# Patient Record
Sex: Female | Born: 1970 | Race: White | Hispanic: No | Marital: Married | Smoking: Never smoker
Health system: Southern US, Community
[De-identification: ages and names within clinical notes are randomized; demographics above are authoritative.]

## PROBLEM LIST (undated history)

## (undated) DIAGNOSIS — D481 Neoplasm of uncertain behavior of connective and other soft tissue: Secondary | ICD-10-CM

---

## 1999-09-16 ENCOUNTER — Other Ambulatory Visit: Admission: RE | Admit: 1999-09-16 | Discharge: 1999-09-16 | Payer: Self-pay | Admitting: Obstetrics and Gynecology

## 2000-04-02 ENCOUNTER — Inpatient Hospital Stay (HOSPITAL_COMMUNITY): Admission: AD | Admit: 2000-04-02 | Discharge: 2000-04-02 | Payer: Self-pay | Admitting: Obstetrics and Gynecology

## 2000-04-03 ENCOUNTER — Inpatient Hospital Stay (HOSPITAL_COMMUNITY): Admission: AD | Admit: 2000-04-03 | Discharge: 2000-04-05 | Payer: Self-pay | Admitting: Obstetrics and Gynecology

## 2001-01-21 ENCOUNTER — Other Ambulatory Visit: Admission: RE | Admit: 2001-01-21 | Discharge: 2001-01-21 | Payer: Self-pay | Admitting: Obstetrics and Gynecology

## 2001-02-21 ENCOUNTER — Other Ambulatory Visit: Admission: RE | Admit: 2001-02-21 | Discharge: 2001-02-21 | Payer: Self-pay | Admitting: Obstetrics and Gynecology

## 2001-02-21 ENCOUNTER — Encounter (INDEPENDENT_AMBULATORY_CARE_PROVIDER_SITE_OTHER): Payer: Self-pay

## 2001-06-18 ENCOUNTER — Other Ambulatory Visit: Admission: RE | Admit: 2001-06-18 | Discharge: 2001-06-18 | Payer: Self-pay | Admitting: Obstetrics and Gynecology

## 2001-07-27 ENCOUNTER — Inpatient Hospital Stay (HOSPITAL_COMMUNITY): Admission: AD | Admit: 2001-07-27 | Discharge: 2001-07-27 | Payer: Self-pay | Admitting: Obstetrics & Gynecology

## 2001-09-01 ENCOUNTER — Inpatient Hospital Stay (HOSPITAL_COMMUNITY): Admission: AD | Admit: 2001-09-01 | Discharge: 2001-09-03 | Payer: Self-pay | Admitting: Obstetrics & Gynecology

## 2002-06-12 ENCOUNTER — Other Ambulatory Visit: Admission: RE | Admit: 2002-06-12 | Discharge: 2002-06-12 | Payer: Self-pay | Admitting: Gynecology

## 2003-07-08 ENCOUNTER — Other Ambulatory Visit: Admission: RE | Admit: 2003-07-08 | Discharge: 2003-07-08 | Payer: Self-pay | Admitting: Obstetrics and Gynecology

## 2004-04-25 ENCOUNTER — Ambulatory Visit (HOSPITAL_COMMUNITY): Admission: RE | Admit: 2004-04-25 | Discharge: 2004-04-25 | Payer: Self-pay | Admitting: Obstetrics and Gynecology

## 2004-06-21 ENCOUNTER — Inpatient Hospital Stay (HOSPITAL_COMMUNITY): Admission: AD | Admit: 2004-06-21 | Discharge: 2004-06-21 | Payer: Self-pay | Admitting: Obstetrics and Gynecology

## 2004-08-09 ENCOUNTER — Inpatient Hospital Stay (HOSPITAL_COMMUNITY): Admission: AD | Admit: 2004-08-09 | Discharge: 2004-08-11 | Payer: Self-pay | Admitting: Obstetrics and Gynecology

## 2004-09-13 ENCOUNTER — Other Ambulatory Visit: Admission: RE | Admit: 2004-09-13 | Discharge: 2004-09-13 | Payer: Self-pay | Admitting: Obstetrics and Gynecology

## 2005-10-03 ENCOUNTER — Other Ambulatory Visit: Admission: RE | Admit: 2005-10-03 | Discharge: 2005-10-03 | Payer: Self-pay | Admitting: Obstetrics and Gynecology

## 2007-01-27 ENCOUNTER — Inpatient Hospital Stay (HOSPITAL_COMMUNITY): Admission: AD | Admit: 2007-01-27 | Discharge: 2007-01-30 | Payer: Self-pay | Admitting: Obstetrics and Gynecology

## 2007-01-28 ENCOUNTER — Encounter (INDEPENDENT_AMBULATORY_CARE_PROVIDER_SITE_OTHER): Payer: Self-pay | Admitting: Specialist

## 2007-07-01 ENCOUNTER — Emergency Department (HOSPITAL_COMMUNITY): Admission: EM | Admit: 2007-07-01 | Discharge: 2007-07-01 | Payer: Self-pay | Admitting: Emergency Medicine

## 2009-03-25 ENCOUNTER — Ambulatory Visit (HOSPITAL_COMMUNITY): Admission: RE | Admit: 2009-03-25 | Discharge: 2009-03-25 | Payer: Self-pay | Admitting: Obstetrics and Gynecology

## 2009-03-25 HISTORY — PX: LAPAROSCOPIC TUBAL LIGATION: SUR803

## 2009-09-18 DIAGNOSIS — D481 Neoplasm of uncertain behavior of connective and other soft tissue: Secondary | ICD-10-CM

## 2009-09-18 DIAGNOSIS — D48119 Desmoid tumor of unspecified site: Secondary | ICD-10-CM

## 2009-09-18 HISTORY — DX: Neoplasm of uncertain behavior of connective and other soft tissue: D48.1

## 2009-09-18 HISTORY — DX: Desmoid tumor of unspecified site: D48.119

## 2009-12-16 ENCOUNTER — Encounter: Admission: RE | Admit: 2009-12-16 | Discharge: 2009-12-16 | Payer: Self-pay | Admitting: Obstetrics and Gynecology

## 2009-12-24 ENCOUNTER — Encounter: Admission: RE | Admit: 2009-12-24 | Discharge: 2009-12-24 | Payer: Self-pay | Admitting: Obstetrics and Gynecology

## 2010-01-26 ENCOUNTER — Ambulatory Visit (HOSPITAL_BASED_OUTPATIENT_CLINIC_OR_DEPARTMENT_OTHER): Admission: RE | Admit: 2010-01-26 | Discharge: 2010-01-26 | Payer: Self-pay | Admitting: Surgery

## 2010-01-26 HISTORY — PX: CHEST WALL RESECTION: SHX914

## 2010-02-07 ENCOUNTER — Encounter: Admission: RE | Admit: 2010-02-07 | Discharge: 2010-02-07 | Payer: Self-pay | Admitting: Orthopaedic Surgery

## 2010-05-10 ENCOUNTER — Emergency Department (HOSPITAL_COMMUNITY): Admission: EM | Admit: 2010-05-10 | Discharge: 2010-05-10 | Payer: Self-pay | Admitting: Emergency Medicine

## 2010-10-09 ENCOUNTER — Encounter: Payer: Self-pay | Admitting: Obstetrics and Gynecology

## 2010-12-06 LAB — POCT HEMOGLOBIN-HEMACUE: Hemoglobin: 13.7 g/dL (ref 12.0–15.0)

## 2010-12-26 LAB — CBC
Hemoglobin: 14.8 g/dL (ref 12.0–15.0)
MCHC: 34.6 g/dL (ref 30.0–36.0)
MCV: 87.6 fL (ref 78.0–100.0)
RBC: 4.87 MIL/uL (ref 3.87–5.11)
WBC: 9.1 10*3/uL (ref 4.0–10.5)

## 2011-01-31 NOTE — Op Note (Signed)
Alexis Cannon, Alexis Cannon             ACCOUNT NO.:  [id number]   MEDICAL RECORD NO.:  [id number]          PATIENT TYPE:  AMB   LOCATION:  SDC                           FACILITY:  [facility]   PHYSICIAN:  Malva Limes, M.D.    DATE OF BIRTH:  1970/12/25   DATE OF PROCEDURE:  03/25/2009  DATE OF DISCHARGE:                               OPERATIVE REPORT   PREOPERATIVE DIAGNOSES:  1. Menorrhagia.  2. The patient desires primary sterilization.   POSTOPERATIVE DIAGNOSES:  1. Menorrhagia.  2. The patient desires primary sterilization.   PROCEDURES:  1. Laparoscopic application of Filshie clips.  2. Dilation and curettage.  3. NovaSure endometrial ablation.   SURGEON:  Malva Limes, MD   ANESTHESIA:  General.   ANTIBIOTICS:  Ancef 1 g.   DRAINS:  Red rubber catheter to bladder.   COMPLICATIONS:  None.   SPECIMENS:  None.   FINDINGS:  The patient had normal fallopian tubes and ovaries  bilaterally.  Uterus appeared to be normal with no evidence of  endometriosis or adhesions.   DESCRIPTION OF PROCEDURE:  The patient was taken to the operating room  where general anesthetic was administered without difficulty.  She was  then placed in dorsal lithotomy position.  She was prepped and draped in  the usual fashion for this procedure.  Her bladder was drained with a  red rubber catheter.  Umbilicus was injected with 0.25% Marcaine.  A  vertical skin incision was made.  The fascia was identified, grasped  with 2 Kochers and entered with Mayo scissors.  Parietal peritoneum was  entered sharply and 0 Vicryl suture was placed in pursestring fashion.  The Hasson cannula was placed in the peritoneal cavity.  The abdominal  cavity was insufflated with 3 L of carbon dioxide.  The patient was  placed in Trendelenburg.  Examination of the pelvic viscera was  undertaken with findings as noted above.  At this point, the Filshie  clip applicator was placed through the scope.  A Filshie clip was  placed  on the right fallopian tube in the isthmic portion.  The clip was placed  perpendicular to the tube.  The entire tube appeared to be within the  clasp.  The clasp appeared to be tightly closed.  Similar procedure was  performed on the opposite side.  At this point, the pneumoperitoneum and  instruments were removed.  The fascia was closed with 0 Vicryl suture in  a pursestring fashion.  The skin with 4-0 Vicryl suture in subcuticular  fashion.  At this point, attention was turned to the vagina, where a  single-tooth tenaculum was applied to the anterior cervical lip.  The  cervix was serially dilated to a 29-French.  The uterus was sounded 9  cm.  The cervical length was 3 cm.  Sharp curettage was then performed.  NovaSure device was placed into abdominal cavity and opened, the width  was 4.4 cm and the length of the cavity 6 cm.  The NovaSure device was  turned on for a time total of 55 seconds.  The patient the tolerated  procedure well.  The device was removed.  The patient was extubated,  taken to recovery room in stable condition.  Instrument and lap counts  were correct x1.  The patient will be discharged to home.  She will  follow up in the office in 4 weeks.  She was sent home with Percocet to  take p.r.n.           ______________________________  Malva Limes, M.D.     MA/MEDQ  D:  03/25/2009  T:  03/25/2009  Job:  [id number]

## 2011-02-03 NOTE — Discharge Summary (Signed)
NAMEELI, Alexis Cannon             ACCOUNT NO.:  [id number]   MEDICAL RECORD NO.:  [id number]          PATIENT TYPE:  INP   LOCATION:  9136                          FACILITY:  [facility]   PHYSICIAN:  Gerrit Friends. Aldona Bar, M.D.   DATE OF BIRTH:  Jan 24, 1971   DATE OF ADMISSION:  08/09/2004  DATE OF DISCHARGE:                                 DISCHARGE SUMMARY   DISCHARGE DIAGNOSES:  1.  Term pregnancy, delivered, 8-pound 12-ounce female infant, Apgars 9 and 9.  2.  Blood type A positive.  3.  Induction of labor.   PROCEDURES:  Normal spontaneous delivery.   SUMMARY:  This 40 year old gravida 3 para 2 was admitted at [redacted] weeks  gestation for induction after an uncomplicated pregnancy.  At the time of  admission she was 1 cm dilated, 50% effaced, vertex, -2 station.  Amniotomy  was carried out with production of clear fluid and shortly thereafter she  was begun on Pitocin augmentation.  She progressed and subsequently had a  normal spontaneous delivery over a intact perineum of a viable 8-pound 12-  ounce female infant with Apgars of 9 and 9.  Her postpartum course was  uncomplicated.  Her discharge hemoglobin was 8.6 with a white count of  15,800 and a platelet count of 217,000.  On the morning of August 11, 2004  she was ambulating well, tolerating a regular diet well, having normal bowel  and bladder function, was afebrile, and she was both breastfeeding and  bottle feeding.  She was desirous of discharge and accordingly was given all  appropriate instructions and discharged to home.  Discharge medications  include vitamins one a day, ferrous sulfate 300 mg once or twice a day, and  she will use Motrin or Tylenol as needed for discomfort.  She will return to  the office for follow-up in approximately 4 weeks time.   CONDITION ON DISCHARGE:  Improved.      RMW/MEDQ  D:  08/11/2004  T:  08/11/2004  Job:  [id number]

## 2014-03-29 ENCOUNTER — Emergency Department (HOSPITAL_COMMUNITY)
Admission: EM | Admit: 2014-03-29 | Discharge: 2014-03-29 | Disposition: A | Discharge status: Home / Self Care | Payer: BC Managed Care – PPO | Source: Home / Self Care | Attending: Family Medicine | Admitting: Family Medicine

## 2014-03-29 ENCOUNTER — Encounter (HOSPITAL_COMMUNITY): Payer: Self-pay | Admitting: Emergency Medicine

## 2014-03-29 DIAGNOSIS — IMO0002 Reserved for concepts with insufficient information to code with codable children: Secondary | ICD-10-CM

## 2014-03-29 DIAGNOSIS — S39011A Strain of muscle, fascia and tendon of abdomen, initial encounter: Secondary | ICD-10-CM

## 2014-03-29 DIAGNOSIS — X58XXXA Exposure to other specified factors, initial encounter: Secondary | ICD-10-CM

## 2014-03-29 LAB — CBC
HEMATOCRIT: 40.7 % (ref 36.0–46.0)
HEMOGLOBIN: 13.3 g/dL (ref 12.0–15.0)
MCH: 28.2 pg (ref 26.0–34.0)
MCHC: 32.7 g/dL (ref 30.0–36.0)
MCV: 86.4 fL (ref 78.0–100.0)
PLATELETS: 290 10*3/uL (ref 150–400)
RBC: 4.71 MIL/uL (ref 3.87–5.11)
RDW: 13.2 % (ref 11.5–15.5)
WBC: 11.3 10*3/uL — AB (ref 4.0–10.5)

## 2014-03-29 LAB — COMPREHENSIVE METABOLIC PANEL
ALBUMIN: 4 g/dL (ref 3.5–5.2)
ALK PHOS: 101 U/L (ref 39–117)
ALT: 36 U/L — AB (ref 0–35)
AST: 19 U/L (ref 0–37)
Anion gap: 16 — ABNORMAL HIGH (ref 5–15)
BUN: 11 mg/dL (ref 6–23)
CO2: 26 meq/L (ref 19–32)
Calcium: 9.3 mg/dL (ref 8.4–10.5)
Chloride: 97 mEq/L (ref 96–112)
Creatinine, Ser: 0.61 mg/dL (ref 0.50–1.10)
GFR calc non Af Amer: >90 mL/min (ref >90)
GLUCOSE: 104 mg/dL — AB (ref 70–99)
Potassium: 3.9 mEq/L (ref 3.7–5.3)
Sodium: 139 mEq/L (ref 137–147)
Total Bilirubin: 0.3 mg/dL (ref 0.3–1.2)
Total Protein: 7.8 g/dL (ref 6.0–8.3)

## 2014-03-29 LAB — POCT URINALYSIS DIP (DEVICE)
Bilirubin Urine: NEGATIVE
Glucose, UA: NEGATIVE mg/dL
KETONES UR: NEGATIVE mg/dL
Nitrite: NEGATIVE
PROTEIN: NEGATIVE mg/dL
Specific Gravity, Urine: 1.02 (ref 1.005–1.030)
UROBILINOGEN UA: 0.2 mg/dL (ref 0.0–1.0)
pH: 5.5 (ref 5.0–8.0)

## 2014-03-29 LAB — LIPASE, BLOOD: Lipase: 19 U/L (ref 11–59)

## 2014-03-29 LAB — POCT PREGNANCY, URINE: Preg Test, Ur: NEGATIVE

## 2014-03-29 MED ORDER — GI COCKTAIL ~~LOC~~
30.0000 mL | Freq: Once | ORAL | Status: AC
  Administered 2014-03-29: 30 mL via ORAL

## 2014-03-29 MED ORDER — OMEPRAZOLE 40 MG PO CPDR: 40.0000 mg | DELAYED_RELEASE_CAPSULE | Freq: Every day | ORAL | Status: DC

## 2014-03-29 MED ORDER — TRAMADOL HCL 50 MG PO TABS: 50.0000 mg | ORAL_TABLET | Freq: Four times a day (QID) | ORAL | Status: DC | PRN

## 2014-03-29 MED ORDER — GI COCKTAIL ~~LOC~~
ORAL | Status: AC
  Filled 2014-03-29: qty 30

## 2014-03-29 NOTE — ED Notes (Signed)
C/o hernia in abd area since last Friday States she is nausea Admits to pain when moving

## 2014-03-29 NOTE — Discharge Instructions (Signed)
Thank you for coming in today. Take omeprazole for the possibility of stomach irritation Use tramadol for severe pain.  I think you have a strain of the abdominal wall muscles.  Go to the emergency room if you get worse.  If your belly pain worsens, or you have high fever, bad vomiting, blood in your stool or black tarry stool go to the Emergency Room.   Abdominal Pain, Women Abdominal (stomach, pelvic, or belly) pain can be caused by many things. It is important to tell your doctor:  The location of the pain.  Does it come and go or is it present all the time?  Are there things that start the pain (eating certain foods, exercise)?  Are there other symptoms associated with the pain (fever, nausea, vomiting, diarrhea)? All of this is helpful to know when trying to find the cause of the pain. CAUSES   Stomach: virus or bacteria infection, or ulcer.  Intestine: appendicitis (inflamed appendix), regional ileitis (Crohn's disease), ulcerative colitis (inflamed colon), irritable bowel syndrome, diverticulitis (inflamed diverticulum of the colon), or cancer of the stomach or intestine.  Gallbladder disease or stones in the gallbladder.  Kidney disease, kidney stones, or infection.  Pancreas infection or cancer.  Fibromyalgia (pain disorder).  Diseases of the female organs:  Uterus: fibroid (non-cancerous) tumors or infection.  Fallopian tubes: infection or tubal pregnancy.  Ovary: cysts or tumors.  Pelvic adhesions (scar tissue).  Endometriosis (uterus lining tissue growing in the pelvis and on the pelvic organs).  Pelvic congestion syndrome (female organs filling up with blood just before the menstrual period).  Pain with the menstrual period.  Pain with ovulation (producing an egg).  Pain with an IUD (intrauterine device, birth control) in the uterus.  Cancer of the female organs.  Functional pain (pain not caused by a disease, may improve without  treatment).  Psychological pain.  Depression. DIAGNOSIS  Your doctor will decide the seriousness of your pain by doing an examination.  Blood tests.  X-rays.  Ultrasound.  CT scan (computed tomography, special type of X-ray).  MRI (magnetic resonance imaging).  Cultures, for infection.  Barium enema (dye inserted in the large intestine, to better view it with X-rays).  Colonoscopy (looking in intestine with a lighted tube).  Laparoscopy (minor surgery, looking in abdomen with a lighted tube).  Major abdominal exploratory surgery (looking in abdomen with a large incision). TREATMENT  The treatment will depend on the cause of the pain.   Many cases can be observed and treated at home.  Over-the-counter medicines recommended by your caregiver.  Prescription medicine.  Antibiotics, for infection.  Birth control pills, for painful periods or for ovulation pain.  Hormone treatment, for endometriosis.  Nerve blocking injections.  Physical therapy.  Antidepressants.  Counseling with a psychologist or psychiatrist.  Minor or major surgery. HOME CARE INSTRUCTIONS   Do not take laxatives, unless directed by your caregiver.  Take over-the-counter pain medicine only if ordered by your caregiver. Do not take aspirin because it can cause an upset stomach or bleeding.  Try a clear liquid diet (broth or water) as ordered by your caregiver. Slowly move to a bland diet, as tolerated, if the pain is related to the stomach or intestine.  Have a thermometer and take your temperature several times a day, and record it.  Bed rest and sleep, if it helps the pain.  Avoid sexual intercourse, if it causes pain.  Avoid stressful situations.  Keep your follow-up appointments and tests, as your  caregiver orders.  If the pain does not go away with medicine or surgery, you may try:  Acupuncture.  Relaxation exercises (yoga, meditation).  Group therapy.  Counseling. SEEK  MEDICAL CARE IF:   You notice certain foods cause stomach pain.  Your home care treatment is not helping your pain.  You need stronger pain medicine.  You want your IUD removed.  You feel faint or lightheaded.  You develop nausea and vomiting.  You develop a rash.  You are having side effects or an allergy to your medicine. SEEK IMMEDIATE MEDICAL CARE IF:   Your pain does not go away or gets worse.  You have a fever.  Your pain is felt only in portions of the abdomen. The right side could possibly be appendicitis. The left lower portion of the abdomen could be colitis or diverticulitis.  You are passing blood in your stools (bright red or black tarry stools, with or without vomiting).  You have blood in your urine.  You develop chills, with or without a fever.  You pass out. MAKE SURE YOU:   Understand these instructions.  Will watch your condition.  Will get help right away if you are not doing well or get worse. Document Released: 07/02/2007 Document Revised: 11/27/2011 Document Reviewed: 07/22/2009 [facility] Patient Information 2015 [place], [place]. This information is not intended to replace advice given to you by your health care provider. Make sure you discuss any questions you have with your health care provider.

## 2014-03-29 NOTE — ED Provider Notes (Signed)
Alexis Cannon is a 43 y.o. female who presents to Urgent Care today for abdominal pain. Patient has 3 days of midline epigastric abdominal pain associated with nausea. She also notes that she has been moving furniture and doing a lot of coughing recently. She suspects that she may have injured her abdominal wall muscles. She has no vomiting or diarrhea fevers or chills. She denies any significant urinary symptoms or vaginal discharge. She has a history of a bilateral tubal ligation but no other abdominal surgery. She has not tried any medications yet. The pain is moderate and worse with activity. She notes bulging around her umbilicus.   History reviewed. No pertinent past medical history. History  Substance Use Topics  . Smoking status: Not on file  . Smokeless tobacco: Not on file  . Alcohol Use: Not on file   ROS as above Medications: No current facility-administered medications for this encounter.   Current Outpatient Prescriptions  Medication Sig Dispense Refill  . omeprazole (PRILOSEC) 40 MG capsule Take 1 capsule (40 mg total) by mouth daily.  30 capsule  1    Exam:  BP 140/94  Pulse 83  Temp(Src) 98.1 F (36.7 C) (Oral)  Resp 18  SpO2 97%  LMP 03/13/2014 Gen: Well NAD HEENT: EOMI,  MMM Lungs: Normal work of breathing. CTABL Heart: RRR no MRG Abd: NABS, Soft. , ND obese. No significant herniation. Tender palpation to the right of the umbilicus and in the epigastric area with some guarding present. Exts: Brisk capillary refill, warm and well perfused.   Patient had some improvement with GI cocktail.  Results for orders placed during the hospital encounter of 03/29/14 (from the past 24 hour(s))  CBC     Status: Abnormal   Collection Time    03/29/14 11:23 AM      Result Value Ref Range   WBC 11.3 (*) 4.0 - 10.5 K/uL   RBC 4.71  3.87 - 5.11 MIL/uL   Hemoglobin 13.3  12.0 - 15.0 g/dL   HCT 40.7  36.0 - 46.0 %   MCV 86.4  78.0 - 100.0 fL   MCH 28.2  26.0 - 34.0 pg    MCHC 32.7  30.0 - 36.0 g/dL   RDW 13.2  11.5 - 15.5 %   Platelets 290  150 - 400 K/uL  COMPREHENSIVE METABOLIC PANEL     Status: Abnormal   Collection Time    03/29/14 11:23 AM      Result Value Ref Range   Sodium 139  137 - 147 mEq/L   Potassium 3.9  3.7 - 5.3 mEq/L   Chloride 97  96 - 112 mEq/L   CO2 26  19 - 32 mEq/L   Glucose, Bld 104 (*) 70 - 99 mg/dL   BUN 11  6 - 23 mg/dL   Creatinine, Ser 0.61  0.50 - 1.10 mg/dL   Calcium 9.3  8.4 - 10.5 mg/dL   Total Protein 7.8  6.0 - 8.3 g/dL   Albumin 4.0  3.5 - 5.2 g/dL   AST 19  0 - 37 U/L   ALT 36 (*) 0 - 35 U/L   Alkaline Phosphatase 101  39 - 117 U/L   Total Bilirubin 0.3  0.3 - 1.2 mg/dL   GFR calc non Af Amer >90  >90 mL/min   GFR calc Af Amer >90  >90 mL/min   Anion gap 16 (*) 5 - 15  LIPASE, BLOOD     Status: None  Collection Time    03/29/14 11:23 AM      Result Value Ref Range   Lipase 19  11 - 59 U/L  POCT URINALYSIS DIP (DEVICE)     Status: Abnormal   Collection Time    03/29/14 11:34 AM      Result Value Ref Range   Glucose, UA NEGATIVE  NEGATIVE mg/dL   Bilirubin Urine NEGATIVE  NEGATIVE   Ketones, ur NEGATIVE  NEGATIVE mg/dL   Specific Gravity, Urine 1.020  1.005 - 1.030   Hgb urine dipstick TRACE (*) NEGATIVE   pH 5.5  5.0 - 8.0   Protein, ur NEGATIVE  NEGATIVE mg/dL   Urobilinogen, UA 0.2  0.0 - 1.0 mg/dL   Nitrite NEGATIVE  NEGATIVE   Leukocytes, UA MODERATE (*) NEGATIVE  POCT PREGNANCY, URINE     Status: None   Collection Time    03/29/14 11:46 AM      Result Value Ref Range   Preg Test, Ur NEGATIVE  NEGATIVE   No results found.  Assessment and Plan: 43 y.o. female with abdominal wall muscular strain. Most likely etiology. White blood cell count is very mildly elevated as is a healthy. I don't think these are clinically relevant today. Plan for watchful waiting. Additionally we'll use omeprazole as GI cocktail seemed to help. Possible gastritis. Additionally tramadol for pain control. Followup  with primary care provider.  Discussed warning signs or symptoms. Please see discharge instructions. Patient expresses understanding.    Gregor Hams, MD 03/29/14 [phone]

## 2014-10-18 ENCOUNTER — Ambulatory Visit (INDEPENDENT_AMBULATORY_CARE_PROVIDER_SITE_OTHER): Payer: BC Managed Care – PPO | Admitting: Internal Medicine

## 2014-10-18 ENCOUNTER — Ambulatory Visit (INDEPENDENT_AMBULATORY_CARE_PROVIDER_SITE_OTHER): Payer: BC Managed Care – PPO

## 2014-10-18 VITALS — BP 128/88 | HR 96 | Temp 98.3°F | Resp 20 | Ht 64.25 in | Wt 257.0 lb

## 2014-10-18 DIAGNOSIS — R059 Cough, unspecified: Secondary | ICD-10-CM

## 2014-10-18 DIAGNOSIS — R05 Cough: Secondary | ICD-10-CM

## 2014-10-18 DIAGNOSIS — K2970 Gastritis, unspecified, without bleeding: Secondary | ICD-10-CM

## 2014-10-18 DIAGNOSIS — R079 Chest pain, unspecified: Secondary | ICD-10-CM

## 2014-10-18 LAB — COMPLETE METABOLIC PANEL WITH GFR
ALK PHOS: 76 U/L (ref 39–117)
ALT: 60 U/L — ABNORMAL HIGH (ref 0–35)
AST: 31 U/L (ref 0–37)
Albumin: 4.5 g/dL (ref 3.5–5.2)
BUN: 12 mg/dL (ref 6–23)
CO2: 26 meq/L (ref 19–32)
Calcium: 9.9 mg/dL (ref 8.4–10.5)
Chloride: 103 mEq/L (ref 96–112)
Creat: 0.68 mg/dL (ref 0.50–1.10)
GFR, Est African American: >89 mL/min
GFR, Est Non African American: >89 mL/min
Glucose, Bld: 100 mg/dL — ABNORMAL HIGH (ref 70–99)
Potassium: 4.3 mEq/L (ref 3.5–5.3)
SODIUM: 138 meq/L (ref 135–145)
Total Bilirubin: 0.3 mg/dL (ref 0.2–1.2)
Total Protein: 7.5 g/dL (ref 6.0–8.3)

## 2014-10-18 LAB — POCT CBC
Granulocyte percent: 62.7 %G (ref 37–80)
HEMATOCRIT: 43 % (ref 37.7–47.9)
Hemoglobin: 14 g/dL (ref 12.2–16.2)
Lymph, poc: 3.2 (ref 0.6–3.4)
MCH: 27.8 pg (ref 27–31.2)
MCHC: 32.5 g/dL (ref 31.8–35.4)
MCV: 85.6 fL (ref 80–97)
MID (CBC): 0.4 (ref 0–0.9)
MPV: 6.8 fL (ref 0–99.8)
PLATELET COUNT, POC: 321 10*3/uL (ref 142–424)
POC GRANULOCYTE: 6.1 (ref 2–6.9)
POC LYMPH %: 32.8 % (ref 10–50)
POC MID %: 4.5 % (ref 0–12)
RBC: 5.02 M/uL (ref 4.04–5.48)
RDW, POC: 13.4 %
WBC: 9.7 10*3/uL (ref 4.6–10.2)

## 2014-10-18 LAB — LIPID PANEL
CHOLESTEROL: 209 mg/dL — AB (ref 0–200)
HDL: 44 mg/dL (ref >39)
LDL Cholesterol: 98 mg/dL (ref 0–99)
Total CHOL/HDL Ratio: 4.8 Ratio
Triglycerides: 335 mg/dL — ABNORMAL HIGH (ref <150)
VLDL: 67 mg/dL — ABNORMAL HIGH (ref 0–40)

## 2014-10-18 LAB — TSH: TSH: 2.123 u[IU]/mL (ref 0.350–4.500)

## 2014-10-18 MED ORDER — SUCRALFATE 1 GM/10ML PO SUSP: 1.0000 g | Freq: Three times a day (TID) | ORAL | Status: DC

## 2014-10-18 MED ORDER — RANITIDINE HCL 150 MG PO TABS: 150.0000 mg | ORAL_TABLET | Freq: Two times a day (BID) | ORAL | Status: DC

## 2014-10-18 NOTE — Progress Notes (Signed)
Subjective:    Patient ID: Alexis Cannon, female    DOB: 1971-08-27, 44 y.o.   MRN: [id number]  HPI Pt presents to clinic with chest pain for the last week.  She is concerned that something is going on due to all her symptoms 1- cough - for the last about 6 weeks - she had an illness like the flu and all the other symptoms went away but the cough - the cough is worse in the am with white colored sputum - the other time the cough starts is after she eats almost every time not matter what she eats.  She has no SOB or wheezing. 2- about a 1 week h/o of mid back pain that feels like a knife is stabbing her - it does not feel like a muscle spasms - her husband has tried to rub it and that has not helped.  It has slightly gotten better since its onset 1 week ago.  She did not have an injury.  She is not sure whether it is worse when she is having her chest pain. 3- chest pain - pressure and fullness feeling in her chest - worse when she is stressed but it is not worse with activity or with rest.  It does seem to be worse after eating but it does not matter what she eats.  She tried baking soda and that did not help much.  She has tried to decreased her spicy and acidic foods over the last week and she feels like it is slightly better.  Family history - dad with MI and paternal female cousin MI at age 7.  She is under a lot of stress.  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  She is busy all the time.  She tries to eat healthy - organic and non-gmo and she dos not exercise.  She knows she is overweight and she would like to lose weight.  She likes to exercise but she just cannot find the time currently.  She has h/o mild anorexia but she is aware of healthy eating and calories in must be less than calories out for weight loss.  She would like help.    Review of Systems  Constitutional: Negative for fever and chills.  HENT: Negative for congestion, postnasal drip, rhinorrhea and sinus  pressure.   Respiratory: Positive for cough. Negative for shortness of breath and wheezing.   Gastrointestinal: Positive for vomiting (2nd to cough). Negative for nausea, abdominal pain and diarrhea.  Musculoskeletal: Positive for back pain.  Neurological: Negative for dizziness and headaches.   There are no active problems to display for this patient.  Prior to Admission medications   Medication Sig Start Date End Date Taking? Authorizing Provider  COPPER PO Take by mouth.   Yes Historical Provider, MD  Cyanocobalamin (VITAMIN B 12 PO) Take by mouth.   Yes Historical Provider, MD  Omega-3 Fatty Acids (OMEGA 3 PO) Take by mouth.   Yes Historical Provider, MD  VITAMIN A PO Take by mouth.   Yes Historical Provider, MD   Allergies  Allergen Reactions  . Codeine Other (See Comments)    Causes the patient to become hyper.     Medications, allergies, past medical history, surgical history, family history, social history and problem list reviewed and updated.      Objective:   Physical Exam  Constitutional: She is oriented to person, place, and time. She appears well-developed and well-nourished.  BP 128/88 mmHg  Pulse 96  Temp(Src) 98.3 F (36.8 C) (Oral)  Resp 20  Ht 5' 4.25" (1.632 m)  Wt 257 lb (116.574 kg)  BMI 43.77 kg/m2  SpO2 99%  LMP 10/13/2014   HENT:  Head: Normocephalic and atraumatic.  Right Ear: Hearing, tympanic membrane, external ear and ear canal normal.  Left Ear: Hearing, tympanic membrane, external ear and ear canal normal.  Nose: Nose normal.  Mouth/Throat: Uvula is midline, oropharynx is clear and moist and mucous membranes are normal.  Eyes: Conjunctivae are normal.  Neck: Normal range of motion.  Cardiovascular: Normal rate, regular rhythm and normal heart sounds.   No murmur heard. Pulmonary/Chest: Effort normal and breath sounds normal. She has no wheezes.  Abdominal: Soft. There is tenderness (mild TTP RUQ but most TTP is epigastric area) in the  right upper quadrant and epigastric area. There is negative Murphy's sign.  Neurological: She is alert and oriented to person, place, and time.  Skin: Skin is warm and dry.  Psychiatric: She has a normal mood and affect. Her behavior is normal. Judgment and thought content normal.   [facility] reading (PRIMARY) by  Dr. Laney Pastor. NAD  EKG - NSR without acute changes.      Assessment & Plan:  Chest pain, unspecified chest pain type - Unlikely cardiac in origin and most likely related to reflux/gastritis but we are waiting on labs for possible gallbladder due to age and weight.  We will wait for labs an treat if indicated.  Plan: EKG 12-Lead, COMPLETE METABOLIC PANEL WITH GFR, Lipid panel  Cough - I suspect related to reflux.  Plan: POCT CBC, DG Chest 2 View  Obesity, Class III, BMI 40-49.9 (morbid obesity) - we discussed weight loss plans and how to best achieve healthy weight loss.  Plan: TSH  Gastritis - Plan: ranitidine (ZANTAC) 150 MG tablet, sucralfate (CARAFATE) 1 GM/10ML suspension   Windell Hummingbird PA-C  [facility] 10/18/2014 11:17 AM  I have participated in the care of this patient with the Advanced Practice Provider and agree with Diagnosis and Plan as documented. Robert P. Laney Pastor, M.D.

## 2014-10-18 NOTE — Patient Instructions (Signed)
My fitness Pal - to help monitor your food intake  1500 calories a day no less than 1200 calories  30 % protein 50 % carb 20 % fat

## 2014-10-20 ENCOUNTER — Encounter: Payer: Self-pay | Admitting: Physician Assistant

## 2015-07-06 ENCOUNTER — Ambulatory Visit (INDEPENDENT_AMBULATORY_CARE_PROVIDER_SITE_OTHER): Payer: BC Managed Care – PPO | Admitting: Emergency Medicine

## 2015-07-06 ENCOUNTER — Telehealth: Payer: Self-pay | Admitting: Family Medicine

## 2015-07-06 ENCOUNTER — Emergency Department (HOSPITAL_COMMUNITY)
Admission: EM | Admit: 2015-07-06 | Discharge: 2015-07-06 | Disposition: A | Discharge status: Home / Self Care | Payer: BC Managed Care – PPO | Attending: Emergency Medicine | Admitting: Emergency Medicine

## 2015-07-06 ENCOUNTER — Ambulatory Visit (HOSPITAL_COMMUNITY)
Admission: RE | Admit: 2015-07-06 | Discharge: 2015-07-06 | Disposition: A | Discharge status: Home / Self Care | Payer: BC Managed Care – PPO | Source: Ambulatory Visit | Attending: Emergency Medicine | Admitting: Emergency Medicine

## 2015-07-06 ENCOUNTER — Encounter (HOSPITAL_COMMUNITY): Payer: Self-pay | Admitting: Emergency Medicine

## 2015-07-06 VITALS — BP 116/76 | HR 90 | Temp 98.3°F | Resp 16 | Ht 64.25 in | Wt 261.0 lb

## 2015-07-06 DIAGNOSIS — R1030 Lower abdominal pain, unspecified: Secondary | ICD-10-CM | POA: Diagnosis not present

## 2015-07-06 DIAGNOSIS — R10813 Right lower quadrant abdominal tenderness: Secondary | ICD-10-CM

## 2015-07-06 DIAGNOSIS — R10829 Rebound abdominal tenderness, unspecified site: Secondary | ICD-10-CM | POA: Diagnosis not present

## 2015-07-06 DIAGNOSIS — N390 Urinary tract infection, site not specified: Secondary | ICD-10-CM | POA: Diagnosis not present

## 2015-07-06 DIAGNOSIS — R198 Other specified symptoms and signs involving the digestive system and abdomen: Secondary | ICD-10-CM | POA: Diagnosis not present

## 2015-07-06 DIAGNOSIS — R1033 Periumbilical pain: Secondary | ICD-10-CM | POA: Diagnosis present

## 2015-07-06 DIAGNOSIS — R110 Nausea: Secondary | ICD-10-CM | POA: Diagnosis not present

## 2015-07-06 DIAGNOSIS — E66813 Obesity, class 3: Secondary | ICD-10-CM

## 2015-07-06 DIAGNOSIS — K432 Incisional hernia without obstruction or gangrene: Secondary | ICD-10-CM

## 2015-07-06 DIAGNOSIS — K429 Umbilical hernia without obstruction or gangrene: Secondary | ICD-10-CM | POA: Diagnosis not present

## 2015-07-06 DIAGNOSIS — Z862 Personal history of diseases of the blood and blood-forming organs and certain disorders involving the immune mechanism: Secondary | ICD-10-CM | POA: Insufficient documentation

## 2015-07-06 HISTORY — DX: Morbid (severe) obesity due to excess calories: E66.01

## 2015-07-06 HISTORY — DX: Neoplasm of uncertain behavior of connective and other soft tissue: D48.1

## 2015-07-06 HISTORY — DX: Obesity, class 3: E66.813

## 2015-07-06 LAB — POCT CBC
GRANULOCYTE PERCENT: 65.1 % (ref 37–80)
HEMATOCRIT: 39.7 % (ref 37.7–47.9)
HEMOGLOBIN: 13.4 g/dL (ref 12.2–16.2)
LYMPH, POC: 3 (ref 0.6–3.4)
MCH, POC: 28.3 pg (ref 27–31.2)
MCHC: 33.7 g/dL (ref 31.8–35.4)
MCV: 83.9 fL (ref 80–97)
MID (cbc): 0.6 (ref 0–0.9)
MPV: 6.5 fL (ref 0–99.8)
PLATELET COUNT, POC: 303 10*3/uL (ref 142–424)
POC Granulocyte: 6.8 (ref 2–6.9)
POC LYMPH PERCENT: 28.9 %L (ref 10–50)
POC MID %: 6 %M (ref 0–12)
RBC: 4.73 M/uL (ref 4.04–5.48)
RDW, POC: 13.1 %
WBC: 10.4 10*3/uL — AB (ref 4.6–10.2)

## 2015-07-06 LAB — BASIC METABOLIC PANEL
ANION GAP: 8 (ref 5–15)
BUN: 9 mg/dL (ref 6–20)
CO2: 25 mmol/L (ref 22–32)
Calcium: 9 mg/dL (ref 8.9–10.3)
Chloride: 104 mmol/L (ref 101–111)
Creatinine, Ser: 0.64 mg/dL (ref 0.44–1.00)
Glucose, Bld: 113 mg/dL — ABNORMAL HIGH (ref 65–99)
POTASSIUM: 3.5 mmol/L (ref 3.5–5.1)
SODIUM: 137 mmol/L (ref 135–145)

## 2015-07-06 LAB — POCT URINALYSIS DIP (MANUAL ENTRY)
BILIRUBIN UA: NEGATIVE
Glucose, UA: NEGATIVE
Ketones, POC UA: NEGATIVE
Nitrite, UA: NEGATIVE
Spec Grav, UA: 1.025
UROBILINOGEN UA: 0.2
pH, UA: 6.5

## 2015-07-06 LAB — URINALYSIS, ROUTINE W REFLEX MICROSCOPIC
Bilirubin Urine: NEGATIVE
Glucose, UA: NEGATIVE mg/dL
KETONES UR: NEGATIVE mg/dL
NITRITE: NEGATIVE
PROTEIN: NEGATIVE mg/dL
Specific Gravity, Urine: >1.046 — ABNORMAL HIGH (ref 1.005–1.030)
UROBILINOGEN UA: 1 mg/dL (ref 0.0–1.0)
pH: 7.5 (ref 5.0–8.0)

## 2015-07-06 LAB — URINE MICROSCOPIC-ADD ON

## 2015-07-06 LAB — POC MICROSCOPIC URINALYSIS (UMFC): Mucus: ABSENT

## 2015-07-06 LAB — POCT URINE PREGNANCY: Preg Test, Ur: NEGATIVE

## 2015-07-06 MED ORDER — HYDROMORPHONE HCL 1 MG/ML IJ SOLN
1.0000 mg | Freq: Once | INTRAMUSCULAR | Status: AC
  Administered 2015-07-06: 1 mg via INTRAVENOUS
  Filled 2015-07-06: qty 1

## 2015-07-06 MED ORDER — IOHEXOL 300 MG/ML  SOLN
100.0000 mL | Freq: Once | INTRAMUSCULAR | Status: AC | PRN
  Administered 2015-07-06: 100 mL via INTRAVENOUS

## 2015-07-06 MED ORDER — HYDROCODONE-ACETAMINOPHEN 5-325 MG PO TABS: 1.0000 | ORAL_TABLET | Freq: Four times a day (QID) | ORAL | Status: DC | PRN

## 2015-07-06 MED ORDER — CEPHALEXIN 500 MG PO CAPS: 500.0000 mg | ORAL_CAPSULE | Freq: Two times a day (BID) | ORAL | Status: DC

## 2015-07-06 MED ORDER — ONDANSETRON HCL 4 MG/2ML IJ SOLN
4.0000 mg | Freq: Once | INTRAMUSCULAR | Status: AC
  Administered 2015-07-06: 4 mg via INTRAVENOUS
  Filled 2015-07-06: qty 2

## 2015-07-06 MED ORDER — IOHEXOL 300 MG/ML  SOLN
50.0000 mL | Freq: Once | INTRAMUSCULAR | Status: DC | PRN
  Administered 2015-07-06: 50 mL via ORAL
  Filled 2015-07-06: qty 50

## 2015-07-06 NOTE — Telephone Encounter (Signed)
Received call report on CT abd/pelvis. Dr. Ouida Sills notified. See report. Per Dr. Ouida Sills she needs to go to ED for surgical consult. Patient notified and voiced understanding CT showed no appendicitis, she does have a periumbilical hernia which appears clip from fallopian tube has migrated over into that area. She will go to ED. Windell Hummingbird PA-C called and spoke to [place], Camera operator at [employer].

## 2015-07-06 NOTE — ED Notes (Signed)
Pt is c/o pain at her umbilicus that goes to the right and then up  Pt was sent from urgent care sent here for a CT and that showed a hernia and a clamp from her tubal ligation has come loose and is pressing against the hernia  Results were called to urgent care today and they called pt and told her to come to the emergency room  Pt states her pain started yesterday

## 2015-07-06 NOTE — ED Provider Notes (Signed)
CSN: [id number]     Arrival date & time 07/06/15  1906 History   First MD Initiated Contact with Patient 07/06/15 1927     Chief Complaint  Patient presents with  . Abdominal Pain     (Consider location/radiation/quality/duration/timing/severity/associated sxs/prior Treatment) HPI Comments: Pt comes in with periumbilical pain that radiates out and up. Denies fever or vomiting.. She has loose stools. She was seen at urgent care and had a ct scan and was told to come in here. He states that the she has no know history of hernia. She states that the pain is worse with coughing and bending but today it has gotten continuous. She denies fever. No previous abdominal surgeries.   The history is provided by the patient. No language interpreter was used.    Past Medical History  Diagnosis Date  . Anemia    Past Surgical History  Procedure Laterality Date  . Tubal ligation    . 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 Diabetes Father   . Heart disease Father   . Stroke Paternal Grandmother    Social History  Substance Use Topics  . Smoking status: Never Smoker   . Smokeless tobacco: None  . Alcohol Use: No   OB History    No data available     Review of Systems  All other systems reviewed and are negative.     Allergies  Codeine  Home Medications   Prior to Admission medications   Medication Sig Start Date End Date Taking? Authorizing Provider  naproxen sodium (ANAPROX) 220 MG tablet Take 220 mg by mouth 2 (two) times daily as needed (qpain).   Yes Historical Provider, MD   BP 148/88 mmHg  Pulse 74  Temp(Src) 98.1 F (36.7 C) (Oral)  Resp 20  SpO2 95%  LMP 07/04/2015 (Exact Date) Physical Exam  Constitutional: She is oriented to person, place, and time. She appears well-developed and well-nourished.  Cardiovascular: Normal rate and regular rhythm.   Pulmonary/Chest: Effort normal and breath sounds normal.   Abdominal: Soft. Bowel sounds are normal.  Small firm area noted to the right of the periumbilical area.no redness or warmth noted to the area  Musculoskeletal: Normal range of motion.  Neurological: She is alert and oriented to person, place, and time.  Skin: Skin is warm and dry. No rash noted. No erythema.  Psychiatric: She has a normal mood and affect.  Nursing note and vitals reviewed.   ED Course  Procedures (including critical care time) Labs Review Labs Reviewed  BASIC METABOLIC PANEL  URINALYSIS, ROUTINE W REFLEX MICROSCOPIC (NOT AT [facility])    Imaging Review Ct Abdomen Pelvis W Contrast  07/06/2015  CLINICAL DATA:  Acute right lower quadrant abdominal pain. EXAM: CT ABDOMEN AND PELVIS WITH CONTRAST TECHNIQUE: Multidetector CT imaging of the abdomen and pelvis was performed using the standard protocol following bolus administration of intravenous contrast. CONTRAST:  140mL OMNIPAQUE IOHEXOL 300 MG/ML  SOLN COMPARISON:  CT scan of Feb 07, 2010. FINDINGS: Visualized lung bases are unremarkable. No significant osseous abnormality is noted. No gallstones are noted. Fatty infiltration is noted with sparing around the gallbladder fossa. The spleen and pancreas appear normal. Adrenal glands and kidneys appear normal. No hydronephrosis or renal obstruction is noted. No renal or ureteral calculi are noted. The appendix appears normal. There is no evidence of bowel obstruction. No abnormal fluid collection is noted. There is interval development of  large fat containing periumbilical hernia with inflammatory stranding. This hernia appears to contain the ligation clip that was along the right fallopian tube on the prior exam. Uterus and ovaries appear normal. Ligation clip distal present in region of left lobe fallopian tube. Urinary bladder appears normal. No significant adenopathy is noted. IMPRESSION: Fatty infiltration of the liver. No hydronephrosis or renal obstruction is noted. Normal  appendix. Interval development of large fat containing periumbilical hernia with inflammatory stranding, which contains ligation clip which has presumably migrated from right fallopian tube. These results will be called to the ordering clinician or representative by the Radiologist Assistant, and communication documented in the PACS or zVision Dashboard. Electronically Signed   By: Marijo Conception, M.D.   On: 07/06/2015 16:39   I have personally reviewed and evaluated these images and lab results as part of my medical decision-making.   EKG Interpretation None      MDM   Final diagnoses:  Umbilical hernia without obstruction and without gangrene  UTI (lower urinary tract infection)    8:48 PM Pushed on the area and reduced the firm 9:41 PM Spoke with Dr. Johney Maine and pt is to follow up. Discussed return precautions with pt. Pt is in agreement with plan    Glendell Docker, NP 07/06/15 8003  Sherwood Gambler, MD 07/07/15 2357

## 2015-07-06 NOTE — Discharge Instructions (Signed)
Consider appointment at [facility] to consider surgery  Hernia, Adult A hernia is the bulging of an organ or tissue through a weak spot in the muscles of the abdomen (abdominal wall). Hernias develop most often near the navel or groin. There are many kinds of hernias. Common kinds include:  Femoral hernia. This kind of hernia develops under the groin in the upper thigh area.  Inguinal hernia. This kind of hernia develops in the groin or scrotum.  Umbilical hernia. This kind of hernia develops near the navel.  Hiatal hernia. This kind of hernia causes part of the stomach to be pushed up into the chest.  Incisional hernia. This kind of hernia bulges through a scar from an abdominal surgery. CAUSES This condition may be caused by:  Heavy lifting.  Coughing over a long period of time.  Straining to have a bowel movement.  An incision made during an abdominal surgery.  A birth defect (congenital defect).  Excess weight or obesity.  Smoking.  Poor nutrition.  Cystic fibrosis.  Excess fluid in the abdomen.  Undescended testicles. SYMPTOMS Symptoms of a hernia include:  A lump on the abdomen. This is the first sign of a hernia. The lump may become more obvious with standing, straining, or coughing. It may get bigger over time if it is not treated or if the condition causing it is not treated.  Pain. A hernia is usually painless, but it may become painful over time if treatment is delayed. The pain is usually dull and may get worse with standing or lifting heavy objects. Sometimes a hernia gets tightly squeezed in the weak spot (strangulated) or stuck there (incarcerated) and causes additional symptoms. These symptoms may include:  Vomiting.  Nausea.  Constipation.  Irritability. DIAGNOSIS A hernia may be diagnosed with:  A physical exam. During the exam your health care provider may ask you to cough or to make a specific movement, because a hernia is  usually more visible when you move.  Imaging tests. These can include:  X-rays.  Ultrasound.  CT scan. TREATMENT A hernia that is small and painless may not need to be treated. A hernia that is large or painful may be treated with surgery. Inguinal hernias may be treated with surgery to prevent incarceration or strangulation. Strangulated hernias are always treated with surgery, because lack of blood to the trapped organ or tissue can cause it to die. Surgery to treat a hernia involves pushing the bulge back into place and repairing the weak part of the abdomen. HOME CARE INSTRUCTIONS  Avoid straining.  Do not lift anything heavier than 10 lb (4.5 kg).  Lift with your leg muscles, not your back muscles. This helps avoid strain.  When coughing, try to cough gently.  Prevent constipation. Constipation leads to straining with bowel movements, which can make a hernia worse or cause a hernia repair to break down. You can prevent constipation by:  Eating a high-fiber diet that includes plenty of fruits and vegetables.  Drinking enough fluids to keep your urine clear or pale yellow. Aim to drink 6-8 glasses of water per day.  Using a stool softener as directed by your health care provider.  Lose weight, if you are overweight.  Do not use any tobacco products, including cigarettes, chewing tobacco, or electronic cigarettes. If you need help quitting, ask your health care provider.  Keep all follow-up visits as directed by your health care provider. This is important. Your health care provider may need to  monitor your condition. SEEK MEDICAL CARE IF:  You have swelling, redness, and pain in the affected area.  Your bowel habits change. SEEK IMMEDIATE MEDICAL CARE IF:  You have a fever.  You have abdominal pain that is getting worse.  You feel nauseous or you vomit.  You cannot push the hernia back in place by gently pressing on it while you are lying down.  The  hernia:  Changes in shape or size.  Is stuck outside the abdomen.  Becomes discolored.  Feels hard or tender.   This information is not intended to replace advice given to you by your health care provider. Make sure you discuss any questions you have with your health care provider.   Document Released: 09/04/2005 Document Revised: 09/25/2014 Document Reviewed: 07/15/2014 Elsevier Interactive Patient Education 2016 [place].  Managing Pain  Pain after surgery or related to activity is often due to strain/injury to muscle, tendon, nerves and/or incisions.  This pain is usually short-term and will improve in a few months.   Many people find it helpful to do the following things TOGETHER to help speed the process of healing and to get back to regular activity more quickly:  1. Avoid heavy physical activity at first a. No lifting greater than 20 pounds at first, then increase to lifting as tolerated over the next few weeks b. Do not push through the pain.  Listen to your body and avoid positions and maneuvers than reproduce the pain.  Wait a few days before trying something more intense c. Walking is okay as tolerated, but go slowly and stop when getting sore.  If you can walk 30 minutes without stopping or pain, you can try more intense activity (running, jogging, aerobics, cycling, swimming, treadmill, sex, sports, weightlifting, etc ) d. Remember: If it hurts to do it, then dont do it!  2. Take Anti-inflammatory medication a. Choose ONE of the following over-the-counter medications: i.            Acetaminophen 500mg  tabs (Tylenol) 1-2 pills with every meal and just before bedtime (avoid if you have liver problems) ii.            Naproxen 220mg  tabs (ex. Aleve) 1-2 pills twice a day (avoid if you have kidney, stomach, IBD, or bleeding problems) iii. Ibuprofen 200mg  tabs (ex. Advil, Motrin) 3-4 pills with every meal and just before bedtime (avoid if you have kidney, stomach, IBD,  or bleeding problems) b. Take with food/snack around the clock for 1-2 weeks i. This helps the muscle and nerve tissues become less irritable and calm down faster  3. Use a Heating pad or Ice/Cold Pack a. 4-6 times a day b. May use warm bath/hottub  or showers  4. Try Gentle Massage and/or Stretching  a. at the area of pain many times a day b. stop if you feel pain - do not overdo it  Try these steps together to help you body heal faster and avoid making things get worse.  Doing just one of these things may not be enough.    If you are not getting better after two weeks or are noticing you are getting worse, contact our office for further advice; we may need to re-evaluate you & see what other things we can do to help.  GETTING TO GOOD BOWEL HEALTH. Irregular bowel habits such as constipation and diarrhea can lead to many problems over time.  Having one soft bowel movement a day is the most important way  to prevent further problems.  The anorectal canal is designed to handle stretching and feces to safely manage our ability to get rid of solid waste (feces, poop, stool) out of our body.  BUT, hard constipated stools can act like ripping concrete bricks and diarrhea can be a burning fire to this very sensitive area of our body, causing inflamed hemorrhoids, anal fissures, increasing risk is perirectal abscesses, abdominal pain/bloating, an making irritable bowel worse.      The goal: ONE SOFT BOWEL MOVEMENT A DAY!  To have soft, regular bowel movements:   Drink plenty of fluids, consider 4-6 tall glasses of water a day.    Take plenty of fiber.  Fiber is the undigested part of plant food that passes into the colon, acting s natures broom to encourage bowel motility and movement.  Fiber can absorb and hold large amounts of water. This results in a larger, bulkier stool, which is soft and easier to pass. Work gradually over several weeks up to 6 servings a day of fiber (25g a day even more if  needed) in the form of: o Vegetables -- Root (potatoes, carrots, turnips), leafy green (lettuce, salad greens, celery, spinach), or cooked high residue (cabbage, broccoli, etc) o Fruit -- Fresh (unpeeled skin & pulp), Dried (prunes, apricots, cherries, etc ),  or stewed ( applesauce)  o Whole grain breads, pasta, etc (whole wheat)  o Bran cereals   Bulking Agents -- This type of water-retaining fiber generally is easily obtained each day by one of the following:  o Psyllium bran -- The psyllium plant is remarkable because its ground seeds can retain so much water. This product is available as Metamucil, Konsyl, Effersyllium, Per Diem Fiber, or the less expensive generic preparation in drug and health food stores. Although labeled a laxative, it really is not a laxative.  o Methylcellulose -- This is another fiber derived from wood which also retains water. It is available as Citrucel. o Polyethylene Glycol - and artificial fiber commonly called Miralax or Glycolax.  It is helpful for people with gassy or bloated feelings with regular fiber o Flax Seed - a less gassy fiber than psyllium  No reading or other relaxing activity while on the toilet. If bowel movements take longer than 5 minutes, you are too constipated  AVOID CONSTIPATION.  High fiber and water intake usually takes care of this.  Sometimes a laxative is needed to stimulate more frequent bowel movements, but   Laxatives are not a good long-term solution as it can wear the colon out.  They can help jump-start bowels if constipated, but should be relied on constantly without discussing with your doctor o Osmotics (Milk of Magnesia, Fleets phosphosoda, Magnesium citrate, MiraLax, GoLytely) are safer than  o Stimulants (Senokot, Castor Oil, Dulcolax, Ex Lax)    o Avoid taking laxatives for more than 7 days in a row.   IF SEVERELY CONSTIPATED, try a Bowel Retraining Program: o Do not use laxatives.  o Eat a diet high in roughage, such  as bran cereals and leafy vegetables.  o Drink six (6) ounces of prune or apricot juice each morning.  o Eat two (2) large servings of stewed fruit each day.  o Take one (1) heaping tablespoon of a psyllium-based bulking agent twice a day. Use sugar-free sweetener when possible to avoid excessive calories.  o Eat a normal breakfast.  o Set aside 15 minutes after breakfast to sit on the toilet, but do not strain to have  a bowel movement.  o If you do not have a bowel movement by the third day, use an enema and repeat the above steps.   Controlling diarrhea o Switch to liquids and simpler foods for a few days to avoid stressing your intestines further. o Avoid dairy products (especially milk & ice cream) for a short time.  The intestines often can lose the ability to digest lactose when stressed. o Avoid foods that cause gassiness or bloating.  Typical foods include beans and other legumes, cabbage, broccoli, and dairy foods.  Every person has some sensitivity to other foods, so listen to our body and avoid those foods that trigger problems for you. o Adding fiber (Citrucel, Metamucil, psyllium, Miralax) gradually can help thicken stools by absorbing excess fluid and retrain the intestines to act more normally.  Slowly increase the dose over a few weeks.  Too much fiber too soon can backfire and cause cramping & bloating. o Probiotics (such as active yogurt, Align, etc) may help repopulate the intestines and colon with normal bacteria and calm down a sensitive digestive tract.  Most studies show it to be of mild help, though, and such products can be costly. o Medicines: - Bismuth subsalicylate (ex. Kayopectate, Pepto Bismol) every 30 minutes for up to 6 doses can help control diarrhea.  Avoid if pregnant. - Loperamide (Immodium) can slow down diarrhea.  Start with two tablets (4mg  total) first and then try one tablet every 6 hours.  Avoid if you are having fevers or severe pain.  If you are not better  or start feeling worse, stop all medicines and call your doctor for advice o Call your doctor if you are getting worse or not better.  Sometimes further testing (cultures, endoscopy, X-ray studies, bloodwork, etc) may be needed to help diagnose and treat the cause of the diarrhea.  TROUBLESHOOTING IRREGULAR BOWELS 1) Avoid extremes of bowel movements (no bad constipation/diarrhea) 2) Miralax 17gm mixed in 8oz. water or juice-daily. May use BID as needed.  3) Gas-x,Phazyme, etc. as needed for gas & bloating.  4) Soft,bland diet. No spicy,greasy,fried foods.  5) Prilosec over-the-counter as needed  6) May hold gluten/wheat products from diet to see if symptoms improve.  7)  May try probiotics (Align, Activa, etc) to help calm the bowels down 7) If symptoms become worse call back immediately.  Exercising to Stay Healthy Exercising regularly is important. It has many health benefits, such as:  Improving your overall fitness, flexibility, and endurance.  Increasing your bone density.  Helping with weight control.  Decreasing your body fat.  Increasing your muscle strength.  Reducing stress and tension.  Improving your overall health. In order to become healthy and stay healthy, it is recommended that you do moderate-intensity and vigorous-intensity exercise. You can tell that you are exercising at a moderate intensity if you have a higher heart rate and faster breathing, but you are still able to hold a conversation. You can tell that you are exercising at a vigorous intensity if you are breathing much harder and faster and cannot hold a conversation while exercising. HOW OFTEN SHOULD I EXERCISE? Choose an activity that you enjoy and set realistic goals. Your health care provider can help you to make an activity plan that works for you. Exercise regularly as directed by your health care provider. This may include:   Doing resistance training twice each week, such  as:  Push-ups.  Sit-ups.  Lifting weights.  Using resistance bands.  Doing a given  intensity of exercise for a given amount of time. Choose from these options:  150 minutes of moderate-intensity exercise every week.  75 minutes of vigorous-intensity exercise every week.  A mix of moderate-intensity and vigorous-intensity exercise every week. Children, pregnant women, people who are out of shape, people who are overweight, and older adults may need to consult a health care provider for individual recommendations. If you have any sort of medical condition, be sure to consult your health care provider before starting a new exercise program.  WHAT ARE SOME EXERCISE IDEAS? Some moderate-intensity exercise ideas include:   Walking at a rate of 1 mile in 15 minutes.  Biking.  Hiking.  Golfing.  Dancing. Some vigorous-intensity exercise ideas include:   Walking at a rate of at least 4.5 miles per hour.  Jogging or running at a rate of 5 miles per hour.  Biking at a rate of at least 10 miles per hour.  Lap swimming.  Roller-skating or in-line skating.  Cross-country skiing.  Vigorous competitive sports, such as football, basketball, and soccer.  Jumping rope.  Aerobic dancing. WHAT ARE SOME EVERYDAY ACTIVITIES THAT CAN HELP ME TO GET EXERCISE?  Yard work, such as:  Psychologist, educational.  Raking and bagging leaves.  Washing and waxing your car.  Pushing a stroller.  Shoveling snow.  Gardening.  Washing windows or floors. HOW CAN I BE MORE ACTIVE IN MY DAY-TO-DAY ACTIVITIES?  Use the stairs instead of the elevator.  Take a walk during your lunch break.  If you drive, park your car farther away from work or school.  If you take public transportation, get off one stop early and walk the rest of the way.  Make all of your phone calls while standing up and walking around.  Get up, stretch, and walk around every 30 minutes throughout the day. WHAT  GUIDELINES SHOULD I FOLLOW WHILE EXERCISING?  Do not exercise so much that you hurt yourself, feel dizzy, or get very short of breath.  Consult your health care provider before starting a new exercise program.  Wear comfortable clothes and shoes with good support.  Drink plenty of water while you exercise to prevent dehydration or heat stroke. Body water is lost during exercise and must be replaced.  Work out until you breathe faster and your heart beats faster.   This information is not intended to replace advice given to you by your health care provider. Make sure you discuss any questions you have with your health care provider.   Document Released: 10/07/2010 Document Revised: 09/25/2014 Document Reviewed: 02/05/2014 Elsevier Interactive Patient Education [employer].

## 2015-07-06 NOTE — Consult Note (Signed)
Alexis Cannon  April 08, 1971 [id number]  Patient Care Team: Roselee Culver, MD as PCP - General (Family Medicine) Olga Millers, MD as Consulting Physician (Obstetrics and Gynecology) Coralie Keens, MD as Consulting Physician (General Surgery)  This patient is a 44 y.o.female who calls today for surgical evaluation.   Date of call: 10/18/202016  Surgery: BTL 2010  Reason for call: Umbilical hernia  Called by [facility] ED NP Princess Perna concerning an umbilical hernia.  Morbidly obese female s/p lap BTL 2010 but no other surgeries with c/o abdominal pain for the past 3 days.  Went to Urgent Care.  Some loose BMs but no N/V.  Abdominal tenderness.  Sent for CT scan and fat containing umbilcal hernia noted.  Metal clip in it with fat.  No SB or colon.  Sent to ED.  Umb hernia reducible.  No fluctuance or cellilitis.  No skin brealdown/drainage.  No fever.  No WBC.  Not toxic.  CT scan reviewed - no cellulitis.  Small clip in center of omental fat of 3cm wide umbilical VWH.  NO SB or colon contained.  I asked ED MD to examine as well.  I recommended outpatient follow up to consider elective repair.  Will need mesh given obesity.    Inpatient service very full & OR caseload full tomorrow anyway.  This is not an emergency but will try to get the pt in a reasonable time.    Patient Active Problem List   Diagnosis Date Noted  . Incisional umbilical hernia 63/78/5885  . Obesity, Class III, BMI 40-49.9 (morbid obesity) ([place]) 07/06/2015    Past Medical History  Diagnosis Date  . Anemia   . 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 Tubal ligation  03/25/2009    Freda Munro  . Chest wall resection  01/26/2010    Desmoid tumor, Dr Nedra Hai    Social History   Social History  . Marital Status: Married    Spouse Name: N/A  . Number of Children: N/A  . Years of Education: N/A   Occupational History  . Not on file.    Social History Main Topics  . Smoking status: Never Smoker   . Smokeless tobacco: Not on file  . Alcohol Use: No  . Drug Use: No  . Sexual Activity: Not on file   Other Topics Concern  . 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 Diabetes Father   . Heart disease Father   . Stroke Paternal Grandmother     No current facility-administered medications for this encounter.   Current Outpatient Prescriptions  Medication Sig Dispense Refill  . naproxen sodium (ANAPROX) 220 MG tablet Take 220 mg by mouth 2 (two) times daily as needed (qpain).     Facility-Administered Medications Ordered in Other Encounters  Medication Dose Route Frequency Provider Last Rate Last Dose  . iohexol (OMNIPAQUE) 300 MG/ML solution 50 mL  50 mL Oral Once PRN Medication Radiologist, MD   50 mL at 07/06/15 1329     Allergies  Allergen Reactions  . Codeine Other (See Comments)    Causes the patient to become hyper.     BP 148/88 mmHg  Pulse 74  Temp(Src) 98.1 F (36.7 C) (Oral)  Resp 20  SpO2  95%  LMP 07/04/2015 (Exact Date)  Ct Abdomen Pelvis W Contrast  07/06/2015  CLINICAL DATA:  Acute right lower quadrant abdominal pain. EXAM: CT ABDOMEN AND PELVIS WITH CONTRAST TECHNIQUE: Multidetector CT imaging of the abdomen and pelvis was performed using the standard protocol following bolus administration of intravenous contrast. CONTRAST:  174mL OMNIPAQUE IOHEXOL 300 MG/ML  SOLN COMPARISON:  CT scan of Feb 07, 2010. FINDINGS: Visualized lung bases are unremarkable. No significant osseous abnormality is noted. No gallstones are noted. Fatty infiltration is noted with sparing around the gallbladder fossa. The spleen and pancreas appear normal. Adrenal glands and kidneys appear normal. No hydronephrosis or renal obstruction is noted. No renal or ureteral calculi are noted. The appendix appears normal. There is no  evidence of bowel obstruction. No abnormal fluid collection is noted. There is interval development of large fat containing periumbilical hernia with inflammatory stranding. This hernia appears to contain the ligation clip that was along the right fallopian tube on the prior exam. Uterus and ovaries appear normal. Ligation clip distal present in region of left lobe fallopian tube. Urinary bladder appears normal. No significant adenopathy is noted. IMPRESSION: Fatty infiltration of the liver. No hydronephrosis or renal obstruction is noted. Normal appendix. Interval development of large fat containing periumbilical hernia with inflammatory stranding, which contains ligation clip which has presumably migrated from right fallopian tube. These results will be called to the ordering clinician or representative by the Radiologist Assistant, and communication documented in the PACS or zVision Dashboard. Electronically Signed   By: Marijo Conception, M.D.   On: 07/06/2015 16:39    Note: This dictation was prepared with Dragon/digital dictation along with [employer]. Any transcriptional errors that result from this process are unintentional.   Adin Hector, M.D., F.A.C.S. Gastrointestinal and Minimally Invasive Surgery [facility] [address], [place], [place] [address] [phone] Main / Paging

## 2015-07-06 NOTE — Patient Instructions (Addendum)
Go to [facility] for outpatient CT abd/pelvis. You will register 1st floor radiology.Abdominal Pain, Adult Many things can cause abdominal pain. Usually, abdominal pain is not caused by a disease and will improve without treatment. It can often be observed and treated at home. Your health care provider will do a physical exam and possibly order blood tests and X-rays to help determine the seriousness of your pain. However, in many cases, more time must pass before a clear cause of the pain can be found. Before that point, your health care provider may not know if you need more testing or further treatment. HOME CARE INSTRUCTIONS Monitor your abdominal pain for any changes. The following actions may help to alleviate any discomfort you are experiencing:  Only take over-the-counter or prescription medicines as directed by your health care provider.  Do not take laxatives unless directed to do so by your health care provider.  Try a clear liquid diet (broth, tea, or water) as directed by your health care provider. Slowly move to a bland diet as tolerated. SEEK MEDICAL CARE IF:  You have unexplained abdominal pain.  You have abdominal pain associated with nausea or diarrhea.  You have pain when you urinate or have a bowel movement.  You experience abdominal pain that wakes you in the night.  You have abdominal pain that is worsened or improved by eating food.  You have abdominal pain that is worsened with eating fatty foods.  You have a fever. SEEK IMMEDIATE MEDICAL CARE IF:  Your pain does not go away within 2 hours.  You keep throwing up (vomiting).  Your pain is felt only in portions of the abdomen, such as the right side or the left lower portion of the abdomen.  You pass bloody or black tarry stools. MAKE SURE YOU:  Understand these instructions.  Will watch your condition.  Will get help right away if you are not doing well or get worse.   This information is not  intended to replace advice given to you by your health care provider. Make sure you discuss any questions you have with your health care provider.   Document Released: 06/14/2005 Document Revised: 05/26/2015 Document Reviewed: 05/14/2013 Elsevier Interactive Patient Education [employer].
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
## 2015-07-20 ENCOUNTER — Other Ambulatory Visit: Payer: Self-pay | Admitting: Surgery

## 2015-07-20 NOTE — H&P (Signed)
Alexis Cannon 07/20/2015 8:51 AM Location: [facility] Patient #: [id number] DOB: 1971-07-04 Married / Language: Cleophus Molt / Race: White Female  History of Present Illness Adin Hector MD; 07/20/2015 9:38 AM) The patient is a 44 year old female who presents with an incisional hernia. Pleasant female comes today with her husband. Sent from [place] emergency, Glendell Docker, NP, for concern of incisional hernia.  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 Did feel some pulling & wondered if she had a hernia year ago. Examination underwhelming. However after that episode of coughing she felt more sharp pain. Went to the emergency room over concerns of a possible appendicitis. CT scan showed evidence of a fat containing periumbilical incisional hernia. Metal clip noted. History of a tubal ligation. However hernia was noted to be reducible when I was called. I recommended outpatient follow-up. Returns 10 days later. Eating okay. Having goblets rather regularly, about 3 times a day with the help of a fiber supplement. Normally can walk about 30 minutes on level ground without difficulty. She does not smoke. She is not diabetic. She is not on blood thinners. There is some question of urinary tract infection. She did have some dysuria after starting Keflex not before. No history of UTIs or other infections in a long time.   Other Problems Elbert Ewings, CMA; 51/03/6159 7:37 AM) Umbilical Hernia Repair  Past Surgical History Elbert Ewings, CMA; 07/20/2015 8:51 AM) No pertinent past surgical history  Diagnostic Studies History Elbert Ewings, CMA; 07/20/2015 8:51 AM) Colonoscopy never Mammogram 1-3 years ago Pap Smear 1-5 years ago  Allergies Elbert Ewings, CMA; 07/20/2015 8:51 AM) Codeine Phosphate *ANALGESICS - OPIOID*  Medication History Elbert Ewings, CMA; 07/20/2015 8:51 AM) Hydrocodone-Acetaminophen (5-325MG  Tablet, Oral as needed)  Active. Medications Reconciled  Social History Elbert Ewings, [place]; 07/20/2015 8:51 AM) Alcohol use Occasional alcohol use. Caffeine use Coffee. No drug use Tobacco use Never smoker.  Family History Elbert Ewings, [place]; 07/20/2015 8:51 AM) Heart Disease Father.  Pregnancy / Birth History Elbert Ewings, CMA; 07/20/2015 8:51 AM) Age at menarche 74 years. Gravida 5 Irregular periods Maternal age 38-30 Para 4     Review of Systems Elbert Ewings CMA; 07/20/2015 8:51 AM) General Present- Weight Gain. Not Present- Appetite Loss, Chills, Fatigue, Fever, Night Sweats and Weight Loss. Skin Not Present- Change in Wart/Mole, Dryness, Hives, Jaundice, New Lesions, Non-Healing Wounds, Rash and Ulcer. HEENT Not Present- Earache, Hearing Loss, Hoarseness, Nose Bleed, Oral Ulcers, Ringing in the Ears, Seasonal Allergies, Sinus Pain, Sore Throat, Visual Disturbances, Wears glasses/contact lenses and Yellow Eyes. Respiratory Not Present- Bloody sputum, Chronic Cough, Difficulty Breathing, Snoring and Wheezing. Breast Not Present- Breast Mass, Breast Pain, Nipple Discharge and Skin Changes. Cardiovascular Not Present- Chest Pain, Difficulty Breathing Lying Down, Leg Cramps, Palpitations, Rapid Heart Rate, Shortness of Breath and Swelling of Extremities. Gastrointestinal Present- Abdominal Pain and Gets full quickly at meals. Not Present- Bloating, Bloody Stool, Change in Bowel Habits, Chronic diarrhea, Constipation, Difficulty Swallowing, Excessive gas, Hemorrhoids, Indigestion, Nausea, Rectal Pain and Vomiting. Female Genitourinary Present- Nocturia. Not Present- Frequency, Painful Urination, Pelvic Pain and Urgency. Musculoskeletal Not Present- Back Pain, Joint Pain, Joint Stiffness, Muscle Pain, Muscle Weakness and Swelling of Extremities. Neurological Not Present- Decreased Memory, Fainting, Headaches, Numbness, Seizures, Tingling, Tremor, Trouble walking and Weakness. Psychiatric Not Present-  Anxiety, Bipolar, Change in Sleep Pattern, Depression, Fearful and Frequent crying. Endocrine Not Present- Cold Intolerance, Excessive Hunger, Hair Changes, Heat Intolerance, Hot flashes and New Diabetes. Hematology Not Present- Easy Bruising,  Excessive bleeding, Gland problems, HIV and Persistent Infections.  Vitals Elbert Ewings CMA; 07/20/2015 8:52 AM) 07/20/2015 8:51 AM Weight: 261 lb Height: 63in Body Surface Area: 2.17 m Body Mass Index: 46.23 kg/m  Temp.: 98.64F(Temporal)  Pulse: 83 (Regular)  BP: 130/72 (Sitting, Left Arm, Standard)      Physical Exam Adin Hector MD; 07/20/2015 9:35 AM)  General Mental Status-Alert. General Appearance-Not in acute distress, Not Sickly. Orientation-Oriented X3. Hydration-Well hydrated. Voice-Normal.  Integumentary Global Assessment Upon inspection and palpation of skin surfaces of the - Axillae: non-tender, no inflammation or ulceration, no drainage. and Distribution of scalp and body hair is normal. General Characteristics Temperature - normal warmth is noted.  Head and Neck Head-normocephalic, atraumatic with no lesions or palpable masses. Face Global Assessment - atraumatic, no absence of expression. Neck Global Assessment - no abnormal movements, no bruit auscultated on the right, no bruit auscultated on the left, no decreased range of motion, non-tender. Trachea-midline. Thyroid Gland Characteristics - non-tender.  Eye Eyeball - Left-Extraocular movements intact, No Nystagmus. Eyeball - Right-Extraocular movements intact, No Nystagmus. Cornea - Left-No Hazy. Cornea - Right-No Hazy. Sclera/Conjunctiva - Left-No scleral icterus, No Discharge. Sclera/Conjunctiva - Right-No scleral icterus, No Discharge. Pupil - Left-Direct reaction to light normal. Pupil - Right-Direct reaction to light normal.  ENMT Ears Pinna - Left - no drainage observed, no generalized tenderness observed.  Right - no drainage observed, no generalized tenderness observed. Nose and Sinuses External Inspection of the Nose - no destructive lesion observed. Inspection of the nares - Left - quiet respiration. Right - quiet respiration. Mouth and Throat Lips - Upper Lip - no fissures observed, no pallor noted. Lower Lip - no fissures observed, no pallor noted. Nasopharynx - no discharge present. Oral Cavity/Oropharynx - Tongue - no dryness observed. Oral Mucosa - no cyanosis observed. Hypopharynx - no evidence of airway distress observed.  Chest and Lung Exam Inspection Movements - Normal and Symmetrical. Accessory muscles - No use of accessory muscles in breathing. Palpation Palpation of the chest reveals - Non-tender. Auscultation Breath sounds - Normal and Clear.  Cardiovascular Auscultation Rhythm - Regular. Murmurs & Other Heart Sounds - Auscultation of the heart reveals - No Murmurs and No Systolic Clicks.  Abdomen Inspection Inspection of the abdomen reveals - No Visible peristalsis and No Abnormal pulsations. Umbilicus - No Bleeding, No Urine drainage. Palpation/Percussion Palpation and Percussion of the abdomen reveal - Soft, Non Tender, No Rebound tenderness, No Rigidity (guarding) and No Cutaneous hyperesthesia. Note: Formula obese but soft. Obvious periumbilical mass 6 x 6 cm. Not fully reducible but no evidence of inflammation or guarding. Moderate diastases recti supraumbilically.  Female Genitourinary Sexual Maturity Tanner 5 - Adult hair pattern. Note: Moderate panniculus. Hygiene good panniculus. No inguinal hernias. No yeast infection or cellulitis. No vaginal bleeding nor discharge  Peripheral Vascular Upper Extremity Inspection - Left - No Cyanotic nailbeds, Not Ischemic. Right - No Cyanotic nailbeds, Not Ischemic.  Neurologic Neurologic evaluation reveals -normal attention span and ability to concentrate, able to name objects and repeat phrases. Appropriate fund of  knowledge , normal sensation and normal coordination. Mental Status Affect - not angry, not paranoid. Cranial Nerves-Normal Bilaterally. Gait-Normal.  Neuropsychiatric Mental status exam performed with findings of-able to articulate well with normal speech/language, rate, volume and coherence, thought content normal with ability to perform basic computations and apply abstract reasoning and no evidence of hallucinations, delusions, obsessions or homicidal/suicidal ideation.  Musculoskeletal Global Assessment Spine, Ribs and Pelvis - no instability, subluxation or  laxity. Right Upper Extremity - no instability, subluxation or laxity.  Lymphatic Head & Neck  General Head & Neck Lymphatics: Bilateral - Description - No Localized lymphadenopathy. Axillary  General Axillary Region: Bilateral - Description - No Localized lymphadenopathy. Femoral & Inguinal  Generalized Femoral & Inguinal Lymphatics: Left - Description - No Localized lymphadenopathy. Right - Description - No Localized lymphadenopathy.    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 We do underlay repair with mesh given her obesity and young age.  I showed the CT scans noting the very small titanium clip. We pointed out CT scan reports as well since it was a struggle for them to obtain it through the My chart in [facility]. I would not necessarily chase that since it sitting in omentum but would remove if it's convenient. I do not think it is a serious risk. They seemed more reassured.  They are motivated to consider surgery to repair this. 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  Current Plans You are being scheduled for surgery - Our schedulers will call you.  You should hear from our office's scheduling department  within 5 working days about the location, date, and time of surgery. We try to make accommodations for patient's preferences in scheduling surgery, but sometimes the OR schedule or the surgeon's schedule prevents [place] from making those accommodations.  If you have not heard from our office [phone]) in 5 working days, call the office and ask for your surgeon's nurse.  If you have other questions about your diagnosis, plan, or surgery, call the office and ask for your surgeon's nurse.  Written instructions provided Pt Education - Pamphlet Given - Laparoscopic Hernia Repair: discussed with patient and provided information. The anatomy & physiology of the abdominal wall was discussed. The pathophysiology of hernias was discussed. Natural history risks without surgery including progeressive enlargement, pain, incarceration, & strangulation was discussed. Contributors to complications such as smoking, obesity, diabetes, prior surgery, etc were discussed.  I feel the risks of no intervention will lead to serious problems that outweigh the operative risks; therefore, I recommended surgery to reduce and repair the hernia. I explained laparoscopic techniques with possible need for an open approach. I noted the probable use of mesh to patch and/or buttress the hernia repair  Risks such as bleeding, infection, abscess, need for further treatment, heart attack, death, and other risks were discussed. I noted a good likelihood this will help address the problem. Goals of post-operative recovery were discussed as well. Possibility that this will not correct all symptoms was explained. I stressed the importance of low-impact activity, aggressive pain control, avoiding constipation, & not pushing through pain to minimize risk of post-operative chronic pain or injury. Possibility of reherniation especially with smoking, obesity, diabetes, immunosuppression, and other health conditions was discussed. We will work to  minimize complications.  An educational handout further explaining the pathology & treatment options was given as well. Questions were answered. The patient expresses understanding & wishes to proceed with surgery.  Pt Education - [facility] Hernia Post-Op HCI (Laiya Wisby): discussed with patient and provided information. Pt Education - [facility] Pain Control (Ursula Dermody)  Adin Hector, M.D., F.A.C.S. Gastrointestinal and Minimally Invasive Surgery [facility] [address] [place], [place] [address] [phone] Main / Paging

## 2015-07-28 ENCOUNTER — Other Ambulatory Visit (HOSPITAL_COMMUNITY): Payer: Self-pay | Admitting: *Deleted

## 2015-07-28 ENCOUNTER — Encounter (HOSPITAL_COMMUNITY): Payer: Self-pay | Admitting: *Deleted

## 2015-07-30 ENCOUNTER — Encounter (HOSPITAL_COMMUNITY)
Admission: RE | Disposition: A | Discharge status: Home / Self Care | Payer: Self-pay | Source: Ambulatory Visit | Attending: Surgery

## 2015-07-30 ENCOUNTER — Ambulatory Visit (HOSPITAL_COMMUNITY): Payer: BC Managed Care – PPO | Admitting: Certified Registered Nurse Anesthetist

## 2015-07-30 ENCOUNTER — Ambulatory Visit (HOSPITAL_COMMUNITY)
Admission: RE | Admit: 2015-07-30 | Discharge: 2015-07-31 | Disposition: A | Discharge status: Home / Self Care | Payer: BC Managed Care – PPO | Source: Ambulatory Visit | Attending: Surgery | Admitting: Surgery

## 2015-07-30 ENCOUNTER — Encounter (HOSPITAL_COMMUNITY): Payer: Self-pay | Admitting: *Deleted

## 2015-07-30 DIAGNOSIS — K430 Incisional hernia with obstruction, without gangrene: Secondary | ICD-10-CM | POA: Insufficient documentation

## 2015-07-30 DIAGNOSIS — Z6841 Body Mass Index (BMI) 40.0 and over, adult: Secondary | ICD-10-CM | POA: Diagnosis not present

## 2015-07-30 DIAGNOSIS — K432 Incisional hernia without obstruction or gangrene: Secondary | ICD-10-CM | POA: Diagnosis present

## 2015-07-30 HISTORY — PX: VENTRAL HERNIA REPAIR: SHX424

## 2015-07-30 HISTORY — PX: INSERTION OF MESH: SHX5868

## 2015-07-30 HISTORY — PX: LAPAROSCOPIC LYSIS OF ADHESIONS: SHX5905

## 2015-07-30 LAB — CBC
HCT: 40.4 % (ref 36.0–46.0)
HEMOGLOBIN: 12.9 g/dL (ref 12.0–15.0)
MCH: 28.3 pg (ref 26.0–34.0)
MCHC: 31.9 g/dL (ref 30.0–36.0)
MCV: 88.6 fL (ref 78.0–100.0)
PLATELETS: 250 10*3/uL (ref 150–400)
RBC: 4.56 MIL/uL (ref 3.87–5.11)
RDW: 13.1 % (ref 11.5–15.5)
WBC: 8.2 10*3/uL (ref 4.0–10.5)

## 2015-07-30 LAB — HCG, SERUM, QUALITATIVE: PREG SERUM: NEGATIVE

## 2015-07-30 SURGERY — REPAIR, HERNIA, VENTRAL, LAPAROSCOPIC
Anesthesia: General | Site: Abdomen

## 2015-07-30 MED ORDER — LABETALOL HCL 5 MG/ML IV SOLN
INTRAVENOUS | Status: DC | PRN
  Administered 2015-07-30: 2.5 mg via INTRAVENOUS

## 2015-07-30 MED ORDER — SODIUM CHLORIDE 0.9 % IV SOLN: 250.0000 mL | INTRAVENOUS | Status: DC | PRN

## 2015-07-30 MED ORDER — LACTATED RINGERS IV BOLUS (SEPSIS): 1000.0000 mL | Freq: Three times a day (TID) | INTRAVENOUS | Status: DC | PRN

## 2015-07-30 MED ORDER — HYDROCODONE-ACETAMINOPHEN 5-325 MG PO TABS
1.0000 | ORAL_TABLET | ORAL | Status: DC | PRN
  Administered 2015-07-31 (×3): 1 via ORAL
  Filled 2015-07-30 (×3): qty 1

## 2015-07-30 MED ORDER — 0.9 % SODIUM CHLORIDE (POUR BTL) OPTIME
TOPICAL | Status: DC | PRN
  Administered 2015-07-30: 1000 mL

## 2015-07-30 MED ORDER — BUPIVACAINE LIPOSOME 1.3 % IJ SUSP
INTRAMUSCULAR | Status: DC | PRN
  Administered 2015-07-30: 20 mL

## 2015-07-30 MED ORDER — EPHEDRINE SULFATE 50 MG/ML IJ SOLN
INTRAMUSCULAR | Status: AC
  Filled 2015-07-30: qty 1

## 2015-07-30 MED ORDER — SODIUM CHLORIDE 0.9 % IJ SOLN: 3.0000 mL | INTRAMUSCULAR | Status: DC | PRN

## 2015-07-30 MED ORDER — ONDANSETRON HCL 4 MG/2ML IJ SOLN
INTRAMUSCULAR | Status: AC
  Filled 2015-07-30: qty 2

## 2015-07-30 MED ORDER — SODIUM CHLORIDE 0.9 % IJ SOLN: 3.0000 mL | Freq: Two times a day (BID) | INTRAMUSCULAR | Status: DC

## 2015-07-30 MED ORDER — KETOROLAC TROMETHAMINE 30 MG/ML IJ SOLN
INTRAMUSCULAR | Status: DC | PRN
Start: 2015-07-30 — End: 2015-07-30
  Administered 2015-07-30: 30 mg via INTRAVENOUS

## 2015-07-30 MED ORDER — ONDANSETRON 4 MG PO TBDP
4.0000 mg | ORAL_TABLET | Freq: Four times a day (QID) | ORAL | Status: DC | PRN
Start: 2015-07-30 — End: 2015-07-31

## 2015-07-30 MED ORDER — SODIUM CHLORIDE 0.9 % IV SOLN
8.0000 mg | Freq: Four times a day (QID) | INTRAVENOUS | Status: DC | PRN
  Filled 2015-07-30: qty 4

## 2015-07-30 MED ORDER — HYDROCODONE-ACETAMINOPHEN 10-325 MG PO TABS: 1.0000 | ORAL_TABLET | Freq: Four times a day (QID) | ORAL | Status: AC | PRN

## 2015-07-30 MED ORDER — ONDANSETRON HCL 4 MG/2ML IJ SOLN
INTRAMUSCULAR | Status: DC | PRN
Start: 2015-07-30 — End: 2015-07-30
  Administered 2015-07-30: 4 mg via INTRAVENOUS

## 2015-07-30 MED ORDER — FENTANYL CITRATE (PF) 250 MCG/5ML IJ SOLN
INTRAMUSCULAR | Status: AC
  Filled 2015-07-30: qty 25

## 2015-07-30 MED ORDER — MENTHOL 3 MG MT LOZG
1.0000 | LOZENGE | OROMUCOSAL | Status: DC | PRN
  Filled 2015-07-30: qty 9

## 2015-07-30 MED ORDER — BISACODYL 10 MG RE SUPP: 10.0000 mg | Freq: Two times a day (BID) | RECTAL | Status: DC | PRN

## 2015-07-30 MED ORDER — MIDAZOLAM HCL 5 MG/5ML IJ SOLN
INTRAMUSCULAR | Status: DC | PRN
  Administered 2015-07-30 (×2): 1 mg via INTRAVENOUS
  Administered 2015-07-30: 2 mg via INTRAVENOUS

## 2015-07-30 MED ORDER — MAGIC MOUTHWASH
15.0000 mL | Freq: Four times a day (QID) | ORAL | Status: DC | PRN
  Filled 2015-07-30: qty 15

## 2015-07-30 MED ORDER — LIP MEDEX EX OINT
1.0000 "application " | TOPICAL_OINTMENT | Freq: Two times a day (BID) | CUTANEOUS | Status: DC
  Administered 2015-07-30: 1 via TOPICAL
  Filled 2015-07-30: qty 7

## 2015-07-30 MED ORDER — METHOCARBAMOL 500 MG PO TABS: 1000.0000 mg | ORAL_TABLET | Freq: Four times a day (QID) | ORAL | Status: DC | PRN

## 2015-07-30 MED ORDER — BUPIVACAINE LIPOSOME 1.3 % IJ SUSP
20.0000 mL | INTRAMUSCULAR | Status: DC
  Filled 2015-07-30: qty 20

## 2015-07-30 MED ORDER — SUCCINYLCHOLINE CHLORIDE 20 MG/ML IJ SOLN
INTRAMUSCULAR | Status: DC | PRN
  Administered 2015-07-30: 140 mg via INTRAVENOUS

## 2015-07-30 MED ORDER — FENTANYL CITRATE (PF) 100 MCG/2ML IJ SOLN: 25.0000 ug | INTRAMUSCULAR | Status: DC | PRN

## 2015-07-30 MED ORDER — HYDROMORPHONE HCL 1 MG/ML IJ SOLN: 0.5000 mg | INTRAMUSCULAR | Status: DC | PRN

## 2015-07-30 MED ORDER — SODIUM CHLORIDE 0.9 % IJ SOLN
INTRAMUSCULAR | Status: AC
  Filled 2015-07-30: qty 100

## 2015-07-30 MED ORDER — CHLORHEXIDINE GLUCONATE 4 % EX LIQD: 1.0000 "application " | Freq: Once | CUTANEOUS | Status: DC

## 2015-07-30 MED ORDER — MAGNESIUM HYDROXIDE 400 MG/5ML PO SUSP: 30.0000 mL | Freq: Two times a day (BID) | ORAL | Status: DC | PRN

## 2015-07-30 MED ORDER — ALUM & MAG HYDROXIDE-SIMETH 200-200-20 MG/5ML PO SUSP: 30.0000 mL | Freq: Four times a day (QID) | ORAL | Status: DC | PRN

## 2015-07-30 MED ORDER — LACTATED RINGERS IV SOLN
INTRAVENOUS | Status: DC
  Administered 2015-07-30: 21:00:00 via INTRAVENOUS
  Administered 2015-07-30: 1000 mL via INTRAVENOUS
  Administered 2015-07-30 (×2): via INTRAVENOUS

## 2015-07-30 MED ORDER — MIDAZOLAM HCL 2 MG/2ML IJ SOLN
INTRAMUSCULAR | Status: AC
Start: 2015-07-30 — End: 2015-07-30
  Filled 2015-07-30: qty 4

## 2015-07-30 MED ORDER — PROMETHAZINE HCL 25 MG/ML IJ SOLN
INTRAMUSCULAR | Status: AC
  Filled 2015-07-30: qty 1

## 2015-07-30 MED ORDER — DEXTROSE 5 % IV SOLN
1000.0000 mg | Freq: Four times a day (QID) | INTRAVENOUS | Status: DC | PRN
  Filled 2015-07-30: qty 10

## 2015-07-30 MED ORDER — ACETAMINOPHEN 10 MG/ML IV SOLN
INTRAVENOUS | Status: DC | PRN
  Administered 2015-07-30: 1000 mg via INTRAVENOUS

## 2015-07-30 MED ORDER — PROMETHAZINE HCL 25 MG/ML IJ SOLN: 6.2500 mg | INTRAMUSCULAR | Status: DC | PRN

## 2015-07-30 MED ORDER — HYDROMORPHONE HCL 1 MG/ML IJ SOLN
INTRAMUSCULAR | Status: AC
  Filled 2015-07-30: qty 1

## 2015-07-30 MED ORDER — BUPIVACAINE-EPINEPHRINE 0.25% -1:200000 IJ SOLN
INTRAMUSCULAR | Status: DC | PRN
  Administered 2015-07-30: 100 mL

## 2015-07-30 MED ORDER — PROPOFOL 10 MG/ML IV BOLUS
INTRAVENOUS | Status: AC
  Filled 2015-07-30: qty 20

## 2015-07-30 MED ORDER — KETAMINE HCL 10 MG/ML IJ SOLN
INTRAMUSCULAR | Status: AC
  Filled 2015-07-30: qty 1

## 2015-07-30 MED ORDER — NEOSTIGMINE METHYLSULFATE 10 MG/10ML IV SOLN
INTRAVENOUS | Status: DC | PRN
  Administered 2015-07-30: 4 mg via INTRAVENOUS

## 2015-07-30 MED ORDER — GLYCOPYRROLATE 0.2 MG/ML IJ SOLN
INTRAMUSCULAR | Status: AC
Start: 2015-07-30 — End: 2015-07-30
  Filled 2015-07-30: qty 3

## 2015-07-30 MED ORDER — ROCURONIUM BROMIDE 100 MG/10ML IV SOLN
INTRAVENOUS | Status: AC
  Filled 2015-07-30: qty 1

## 2015-07-30 MED ORDER — ATROPINE SULFATE 0.4 MG/ML IJ SOLN
INTRAMUSCULAR | Status: AC
  Filled 2015-07-30: qty 1

## 2015-07-30 MED ORDER — ACETAMINOPHEN 10 MG/ML IV SOLN
INTRAVENOUS | Status: AC
  Filled 2015-07-30: qty 100

## 2015-07-30 MED ORDER — SODIUM CHLORIDE 0.9 % IJ SOLN
INTRAMUSCULAR | Status: AC
  Filled 2015-07-30: qty 20

## 2015-07-30 MED ORDER — GLYCOPYRROLATE 0.2 MG/ML IJ SOLN
INTRAMUSCULAR | Status: AC
  Filled 2015-07-30: qty 3

## 2015-07-30 MED ORDER — PROMETHAZINE HCL 25 MG/ML IJ SOLN
6.2500 mg | INTRAMUSCULAR | Status: DC | PRN
  Administered 2015-07-30: 12.5 mg via INTRAVENOUS

## 2015-07-30 MED ORDER — NEOSTIGMINE METHYLSULFATE 10 MG/10ML IV SOLN
INTRAVENOUS | Status: AC
  Filled 2015-07-30: qty 1

## 2015-07-30 MED ORDER — BUPIVACAINE-EPINEPHRINE 0.25% -1:200000 IJ SOLN
INTRAMUSCULAR | Status: AC
  Filled 2015-07-30: qty 2

## 2015-07-30 MED ORDER — LIDOCAINE HCL (CARDIAC) 20 MG/ML IV SOLN
INTRAVENOUS | Status: DC | PRN
  Administered 2015-07-30: 100 mg via INTRAVENOUS

## 2015-07-30 MED ORDER — GLYCOPYRROLATE 0.2 MG/ML IJ SOLN
INTRAMUSCULAR | Status: DC | PRN
  Administered 2015-07-30: 0.6 mg via INTRAVENOUS

## 2015-07-30 MED ORDER — DEXAMETHASONE SODIUM PHOSPHATE 10 MG/ML IJ SOLN
INTRAMUSCULAR | Status: DC | PRN
  Administered 2015-07-30: 10 mg via INTRAVENOUS

## 2015-07-30 MED ORDER — CEFAZOLIN SODIUM-DEXTROSE 2-3 GM-% IV SOLR
2.0000 g | INTRAVENOUS | Status: AC
  Administered 2015-07-30: 2 g via INTRAVENOUS

## 2015-07-30 MED ORDER — HYDROMORPHONE HCL 1 MG/ML IJ SOLN
0.2500 mg | INTRAMUSCULAR | Status: DC | PRN
  Administered 2015-07-30 (×2): 0.5 mg via INTRAVENOUS

## 2015-07-30 MED ORDER — PROPOFOL 10 MG/ML IV BOLUS
INTRAVENOUS | Status: DC | PRN
  Administered 2015-07-30: 200 mg via INTRAVENOUS

## 2015-07-30 MED ORDER — ACETAMINOPHEN 500 MG PO TABS
1000.0000 mg | ORAL_TABLET | Freq: Three times a day (TID) | ORAL | Status: DC
  Administered 2015-07-30: 1000 mg via ORAL
  Filled 2015-07-30 (×3): qty 2

## 2015-07-30 MED ORDER — LIDOCAINE HCL (CARDIAC) 20 MG/ML IV SOLN
INTRAVENOUS | Status: AC
  Filled 2015-07-30: qty 5

## 2015-07-30 MED ORDER — NAPROXEN 500 MG PO TABS: 500.0000 mg | ORAL_TABLET | Freq: Two times a day (BID) | ORAL | Status: AC

## 2015-07-30 MED ORDER — KETAMINE HCL 10 MG/ML IJ SOLN
INTRAMUSCULAR | Status: DC | PRN
  Administered 2015-07-30: 20 mg via INTRAVENOUS

## 2015-07-30 MED ORDER — MIDAZOLAM HCL 2 MG/2ML IJ SOLN
INTRAMUSCULAR | Status: AC
  Filled 2015-07-30: qty 4

## 2015-07-30 MED ORDER — PHENOL 1.4 % MT LIQD
2.0000 | OROMUCOSAL | Status: DC | PRN
  Filled 2015-07-30: qty 177

## 2015-07-30 MED ORDER — SODIUM CHLORIDE 0.9 % IJ SOLN
3.0000 mL | Freq: Two times a day (BID) | INTRAMUSCULAR | Status: DC
  Administered 2015-07-30: 3 mL via INTRAVENOUS

## 2015-07-30 MED ORDER — CEFAZOLIN SODIUM-DEXTROSE 2-3 GM-% IV SOLR
INTRAVENOUS | Status: AC
  Filled 2015-07-30: qty 50

## 2015-07-30 MED ORDER — LACTATED RINGERS IR SOLN
Status: DC | PRN
  Administered 2015-07-30: 1000 mL

## 2015-07-30 MED ORDER — METOPROLOL TARTRATE 1 MG/ML IV SOLN
5.0000 mg | Freq: Four times a day (QID) | INTRAVENOUS | Status: DC | PRN
  Filled 2015-07-30: qty 5

## 2015-07-30 MED ORDER — SODIUM CHLORIDE 0.9 % IJ SOLN
INTRAMUSCULAR | Status: AC
  Filled 2015-07-30: qty 10

## 2015-07-30 MED ORDER — KETOROLAC TROMETHAMINE 30 MG/ML IJ SOLN
INTRAMUSCULAR | Status: AC
  Filled 2015-07-30: qty 1

## 2015-07-30 MED ORDER — ONDANSETRON HCL 4 MG/2ML IJ SOLN: 4.0000 mg | Freq: Four times a day (QID) | INTRAMUSCULAR | Status: DC | PRN

## 2015-07-30 MED ORDER — SODIUM CHLORIDE 0.9 % IJ SOLN
INTRAMUSCULAR | Status: DC | PRN
  Administered 2015-07-30: 80 mL

## 2015-07-30 MED ORDER — ROCURONIUM BROMIDE 100 MG/10ML IV SOLN
INTRAVENOUS | Status: DC | PRN
  Administered 2015-07-30 (×2): 10 mg via INTRAVENOUS
  Administered 2015-07-30: 40 mg via INTRAVENOUS

## 2015-07-30 MED ORDER — DIPHENHYDRAMINE HCL 50 MG/ML IJ SOLN
12.5000 mg | Freq: Four times a day (QID) | INTRAMUSCULAR | Status: DC | PRN
Start: 2015-07-30 — End: 2015-07-31

## 2015-07-30 MED ORDER — FENTANYL CITRATE (PF) 100 MCG/2ML IJ SOLN
INTRAMUSCULAR | Status: DC | PRN
  Administered 2015-07-30 (×8): 50 ug via INTRAVENOUS
  Administered 2015-07-30: 100 ug via INTRAVENOUS
  Administered 2015-07-30: 50 ug via INTRAVENOUS
  Administered 2015-07-30: 100 ug via INTRAVENOUS

## 2015-07-30 SURGICAL SUPPLY — 42 items
APPLIER CLIP 5 13 M/L LIGAMAX5 (MISCELLANEOUS)
BINDER ABD UNIV 12 45-62 (WOUND CARE) ×1 IMPLANT
BINDER ABDOMINAL 12 ML 46-62 (SOFTGOODS) IMPLANT
BINDER ABDOMINAL 46IN 62IN (WOUND CARE) ×3
CABLE HIGH FREQUENCY MONO STRZ (ELECTRODE) ×3 IMPLANT
CATH KIT ON-Q SILVERSOAK 7.5IN (CATHETERS) IMPLANT
CHLORAPREP W/TINT 26ML (MISCELLANEOUS) ×6 IMPLANT
CLIP APPLIE 5 13 M/L LIGAMAX5 (MISCELLANEOUS) IMPLANT
CLOSURE WOUND 1/2 X4 (GAUZE/BANDAGES/DRESSINGS) ×2
COVER SURGICAL LIGHT HANDLE (MISCELLANEOUS) ×3 IMPLANT
DECANTER SPIKE VIAL GLASS SM (MISCELLANEOUS) ×6 IMPLANT
DEVICE SECURE STRAP 25 ABSORB (INSTRUMENTS) ×3 IMPLANT
DEVICE TROCAR PUNCTURE CLOSURE (ENDOMECHANICALS) ×3 IMPLANT
DRAPE LAPAROSCOPIC ABDOMINAL (DRAPES) ×3 IMPLANT
DRAPE WARM FLUID 44X44 (DRAPE) ×3 IMPLANT
DRSG TEGADERM 2-3/8X2-3/4 SM (GAUZE/BANDAGES/DRESSINGS) ×9 IMPLANT
ELECT REM PT RETURN 9FT ADLT (ELECTROSURGICAL) ×3
ELECTRODE REM PT RTRN 9FT ADLT (ELECTROSURGICAL) ×1 IMPLANT
GAUZE SPONGE 2X2 8PLY STRL LF (GAUZE/BANDAGES/DRESSINGS) IMPLANT
GLOVE ECLIPSE 8.0 STRL XLNG CF (GLOVE) ×3 IMPLANT
GLOVE INDICATOR 8.0 STRL GRN (GLOVE) ×3 IMPLANT
GOWN STRL REUS W/TWL XL LVL3 (GOWN DISPOSABLE) ×12 IMPLANT
KIT BASIN OR (CUSTOM PROCEDURE TRAY) ×3 IMPLANT
MESH VENTRALIGHT ST 8X10 (Mesh General) ×3 IMPLANT
NEEDLE SPNL 22GX3.5 QUINCKE BK (NEEDLE) ×3 IMPLANT
PEN SKIN MARKING BROAD (MISCELLANEOUS) ×3 IMPLANT
SCISSORS LAP 5X35 DISP (ENDOMECHANICALS) ×3 IMPLANT
SET IRRIG TUBING LAPAROSCOPIC (IRRIGATION / IRRIGATOR) IMPLANT
SHEARS HARMONIC ACE PLUS 36CM (ENDOMECHANICALS) IMPLANT
SLEEVE XCEL OPT CAN 5 100 (ENDOMECHANICALS) ×6 IMPLANT
SPONGE GAUZE 2X2 STER 10/PKG (GAUZE/BANDAGES/DRESSINGS)
STRIP CLOSURE SKIN 1/2X4 (GAUZE/BANDAGES/DRESSINGS) ×4 IMPLANT
SUT MNCRL AB 4-0 PS2 18 (SUTURE) ×3 IMPLANT
SUT PDS AB 1 CT1 27 (SUTURE) ×9 IMPLANT
SUT PROLENE 1 CT 1 30 (SUTURE) ×30 IMPLANT
TOWEL OR 17X26 10 PK STRL BLUE (TOWEL DISPOSABLE) ×3 IMPLANT
TRAY FOLEY W/METER SILVER 14FR (SET/KITS/TRAYS/PACK) ×3 IMPLANT
TRAY FOLEY W/METER SILVER 16FR (SET/KITS/TRAYS/PACK) IMPLANT
TRAY LAPAROSCOPIC (CUSTOM PROCEDURE TRAY) ×3 IMPLANT
TROCAR BLADELESS OPT 5 100 (ENDOMECHANICALS) ×3 IMPLANT
TROCAR XCEL NON-BLD 11X100MML (ENDOMECHANICALS) IMPLANT
TUNNELER SHEATH ON-Q 16GX12 DP (PAIN MANAGEMENT) IMPLANT

## 2015-07-30 NOTE — Discharge Instructions (Signed)
[facility]ERNIA REPAIR: POST OP INSTRUCTIONS ° °1. DIET: Follow a light bland diet the first 24 hours after arrival home, such as soup, liquids, crackers, etc.  Be sure to include lots of fluids daily.  Avoid fast food or heavy meals as your are more likely to get nauseated.  Eat a low fat the next few days after surgery. °2. Take your usually prescribed home medications unless otherwise directed. °3. PAIN CONTROL: °a. Pain is best controlled by a usual combination of three different methods TOGETHER: °i. Ice/Heat °ii. Over the counter pain medication °iii. Prescription pain medication °b. Most patients will experience some swelling and bruising around the hernia(s) such as the bellybutton, groins, or old incisions.  Ice packs or heating pads (30-60 minutes up to 6 times a day) will help. Use ice for the first few days to help decrease swelling and bruising, then switch to heat to help relax tight/sore spots and speed recovery.  Some people prefer to use ice alone, heat alone, alternating between ice & heat.  Experiment to what works for you.  Swelling and bruising can take several weeks to resolve.   °c. It is helpful to take an over-the-counter pain medication regularly for the first few weeks.  Choose one of the following that works best for you: °i. Naproxen (Aleve, etc)  Two 220mg tabs twice a day °ii. Ibuprofen (Advil, etc) Three 200mg tabs four times a day (every meal & bedtime) °iii. Acetaminophen (Tylenol, etc) 325-650mg four times a day (every meal & bedtime) °d. A  prescription for pain medication should be given to you upon discharge.  Take your pain medication as prescribed.  °i. If you are having problems/concerns with the prescription medicine (does not control pain, nausea, vomiting, rash, itching, etc), please call us [phone] to see if we need to switch you to a different pain medicine that will work better for you and/or control your side effect better. °ii. If you need a refill on your pain  medication, please contact your pharmacy.  They will contact our office to request authorization. Prescriptions will not be filled after 5 pm or on week-ends. °4. Avoid getting constipated.  Between the surgery and the pain medications, it is common to experience some constipation.  Increasing fluid intake and taking a fiber supplement (such as Metamucil, Citrucel, FiberCon, MiraLax, etc) 1-2 times a day regularly will usually help prevent this problem from occurring.  A mild laxative (prune juice, Milk of Magnesia, MiraLax, etc) should be taken according to package directions if there are no bowel movements after 48 hours.   °5. Wash / shower every day.  You may shower over the dressings as they are waterproof.   °6. Remove your waterproof bandages 5 days after surgery.  You may leave the incision open to air.  You may replace a dressing/Band-Aid to cover the incision for comfort if you wish.  Continue to shower over incision(s) after the dressing is off. ° ° ° °7. ACTIVITIES as tolerated:   °a. You may resume regular (light) daily activities beginning the next day--such as daily self-care, walking, climbing stairs--gradually increasing activities as tolerated.  If you can walk 30 minutes without difficulty, it is safe to try more intense activity such as jogging, treadmill, bicycling, low-impact aerobics, swimming, etc. °b. Save the most intensive and strenuous activity for last such as sit-ups, heavy lifting, contact sports, etc  Refrain from any heavy lifting or straining until you are off narcotics for pain control.   °  c. DO NOT PUSH THROUGH PAIN.  Let pain be your guide: If it hurts to do something, don't do it.  Pain is your body warning you to avoid that activity for another week until the pain goes down. °d. You may drive when you are no longer taking prescription pain medication, you can comfortably wear a seatbelt, and you can safely maneuver your car and apply brakes. °e. You may have sexual intercourse  when it is comfortable.  °8. FOLLOW UP in our office °a. Please call [facility] at [phone] to set up an appointment to see your surgeon in the office for a follow-up appointment approximately 2-3 weeks after your surgery. °b. Make sure that you call for this appointment the day you arrive home to insure a convenient appointment time. °9.  IF YOU HAVE DISABILITY OR FAMILY LEAVE FORMS, BRING THEM TO THE OFFICE FOR PROCESSING.  DO NOT GIVE THEM TO YOUR DOCTOR. ° °WHEN TO CALL US [phone]: °1. Poor pain control °2. Reactions / problems with new medications (rash/itching, nausea, etc)  °3. Fever over 101.5 F (38.5 C) °4. Inability to urinate °5. Nausea and/or vomiting °6. Worsening swelling or bruising °7. Continued bleeding from incision. °8. Increased pain, redness, or drainage from the incision ° ° The clinic staff is available to answer your questions during regular business hours (8:30am-5pm).  Please don’t hesitate to call and ask to speak to one of our nurses for clinical concerns.  ° If you have a medical emergency, go to the nearest emergency room or call 911. ° A surgeon from [facility] is always on call at the hospitals in [place] ° °[facility] °[address], [address], [place], [place]  [address] ? ° [address] [address], [place], [place]   [address] °MAIN: [phone] ? TOLL FREE: [phone] ? FAX: [phone] °[email or web address] ° °Managing Pain ° °Pain after surgery or related to activity is often due to strain/injury to muscle, tendon, nerves and/or incisions.  This pain is usually short-term and will improve in a few months.  ° °Many people find it helpful to do the following things TOGETHER to help speed the process of healing and to get back to regular activity more quickly: ° °1. Avoid heavy physical activity at first °a. No lifting greater than 20 pounds at first, then increase to lifting as tolerated over the next few weeks °b. Do not “push  through” the pain.  Listen to your body and avoid positions and maneuvers than reproduce the pain.  Wait a few days before trying something more intense °c. Walking is okay as tolerated, but go slowly and stop when getting sore.  If you can walk 30 minutes without stopping or pain, you can try more intense activity (running, jogging, aerobics, cycling, swimming, treadmill, sex, sports, weightlifting, etc ) °d. Remember: If it hurts to do it, then don’t do it! ° °2. Take Anti-inflammatory medication °a. Choose ONE of the following over-the-counter medications: °i.            Acetaminophen 500mg tabs (Tylenol) 1-2 pills with every meal and just before bedtime (avoid if you have liver problems) °ii.            Naproxen 220mg tabs (ex. Aleve) 1-2 pills twice a day (avoid if you have kidney, stomach, IBD, or bleeding problems) °iii. Ibuprofen 200mg tabs (ex. Advil, Motrin) 3-4 pills with every meal and just before bedtime (avoid if you have kidney, stomach, IBD, or bleeding   problems) °b. Take with food/snack around the clock for 1-2 weeks °i. This helps the muscle and nerve tissues become less irritable and calm down faster ° °3. Use a Heating pad or Ice/Cold Pack °a. 4-6 times a day °b. May use warm bath/hottub  or showers ° °4. Try Gentle Massage and/or Stretching  °a. at the area of pain many times a day °b. stop if you feel pain - do not overdo it ° °Try these steps together to help you body heal faster and avoid making things get worse.  Doing just one of these things may not be enough.   ° °If you are not getting better after two weeks or are noticing you are getting worse, contact our office for further advice; we may need to re-evaluate you & see what other things we can do to help. ° °GETTING TO GOOD BOWEL HEALTH. °Irregular bowel habits such as constipation and diarrhea can lead to many problems over time.  Having one soft bowel movement a day is the most important way to prevent further problems.  The  anorectal canal is designed to handle stretching and feces to safely manage our ability to get rid of solid waste (feces, poop, stool) out of our body.  BUT, hard constipated stools can act like ripping concrete bricks and diarrhea can be a burning fire to this very sensitive area of our body, causing inflamed hemorrhoids, anal fissures, increasing risk is perirectal abscesses, abdominal pain/bloating, an making irritable bowel worse.     ° °The goal: ONE SOFT BOWEL MOVEMENT A DAY!  To have soft, regular bowel movements:  °• Drink plenty of fluids, consider 4-6 tall glasses of water a day.   °• Take plenty of fiber.  Fiber is the undigested part of plant food that passes into the colon, acting s “natures broom” to encourage bowel motility and movement.  Fiber can absorb and hold large amounts of water. This results in a larger, bulkier stool, which is soft and easier to pass. Work gradually over several weeks up to 6 servings a day of fiber (25g a day even more if needed) in the form of: °o Vegetables -- Root (potatoes, carrots, turnips), leafy green (lettuce, salad greens, celery, spinach), or cooked high residue (cabbage, broccoli, etc) °o Fruit -- Fresh (unpeeled skin & pulp), Dried (prunes, apricots, cherries, etc ),  or stewed ( applesauce)  °o Whole grain breads, pasta, etc (whole wheat)  °o Bran cereals  °• Bulking Agents -- This type of water-retaining fiber generally is easily obtained each day by one of the following:  °o Psyllium bran -- The psyllium plant is remarkable because its ground seeds can retain so much water. This product is available as Metamucil, Konsyl, Effersyllium, Per Diem Fiber, or the less expensive generic preparation in drug and health food stores. Although labeled a laxative, it really is not a laxative.  °o Methylcellulose -- This is another fiber derived from wood which also retains water. It is available as Citrucel. °o Polyethylene Glycol - and “artificial” fiber commonly called  Miralax or Glycolax.  It is helpful for people with gassy or bloated feelings with regular fiber °o Flax Seed - a less gassy fiber than psyllium °• No reading or other relaxing activity while on the toilet. If bowel movements take longer than 5 minutes, you are too constipated °• AVOID CONSTIPATION.  High fiber and water intake usually takes care of this.  Sometimes a laxative is needed to stimulate more frequent   bowel movements, but  °• Laxatives are not a good long-term solution as it can wear the colon out.  They can help jump-start bowels if constipated, but should be relied on constantly without discussing with your doctor °o Osmotics (Milk of Magnesia, Fleets phosphosoda, Magnesium citrate, MiraLax, GoLytely) are safer than  °o Stimulants (Senokot, Castor Oil, Dulcolax, Ex Lax)    °o Avoid taking laxatives for more than 7 days in a row. °•  IF SEVERELY CONSTIPATED, try a Bowel Retraining Program: °o Do not use laxatives.  °o Eat a diet high in roughage, such as bran cereals and leafy vegetables.  °o Drink six (6) ounces of prune or apricot juice each morning.  °o Eat two (2) large servings of stewed fruit each day.  °o Take one (1) heaping tablespoon of a psyllium-based bulking agent twice a day. Use sugar-free sweetener when possible to avoid excessive calories.  °o Eat a normal breakfast.  °o Set aside 15 minutes after breakfast to sit on the toilet, but do not strain to have a bowel movement.  °o If you do not have a bowel movement by the third day, use an enema and repeat the above steps.  °• Controlling diarrhea °o Switch to liquids and simpler foods for a few days to avoid stressing your intestines further. °o Avoid dairy products (especially milk & ice cream) for a short time.  The intestines often can lose the ability to digest lactose when stressed. °o Avoid foods that cause gassiness or bloating.  Typical foods include beans and other legumes, cabbage, broccoli, and dairy foods.  Every person has  some sensitivity to other foods, so listen to our body and avoid those foods that trigger problems for you. °o Adding fiber (Citrucel, Metamucil, psyllium, Miralax) gradually can help thicken stools by absorbing excess fluid and retrain the intestines to act more normally.  Slowly increase the dose over a few weeks.  Too much fiber too soon can backfire and cause cramping & bloating. °o Probiotics (such as active yogurt, Align, etc) may help repopulate the intestines and colon with normal bacteria and calm down a sensitive digestive tract.  Most studies show it to be of mild help, though, and such products can be costly. °o Medicines: °- Bismuth subsalicylate (ex. Kayopectate, Pepto Bismol) every 30 minutes for up to 6 doses can help control diarrhea.  Avoid if pregnant. °- Loperamide (Immodium) can slow down diarrhea.  Start with two tablets (4mg total) first and then try one tablet every 6 hours.  Avoid if you are having fevers or severe pain.  If you are not better or start feeling worse, stop all medicines and call your doctor for advice °o Call your doctor if you are getting worse or not better.  Sometimes further testing (cultures, endoscopy, X-ray studies, bloodwork, etc) may be needed to help diagnose and treat the cause of the diarrhea. ° °TROUBLESHOOTING IRREGULAR BOWELS °1) Avoid extremes of bowel movements (no bad constipation/diarrhea) °2) Miralax 17gm mixed in 8oz. water or juice-daily. May use BID as needed.  °3) Gas-x,Phazyme, etc. as needed for gas & bloating.  °4) Soft,bland diet. No spicy,greasy,fried foods.  °5) Prilosec over-the-counter as needed  °6) May hold gluten/wheat products from diet to see if symptoms improve.  °7)  May try probiotics (Align, Activa, etc) to help calm the bowels down °7) If symptoms become worse call back immediately. ° °Hernia, Adult °A hernia is the bulging of an organ or tissue through a weak spot   in the muscles of the abdomen (abdominal wall). Hernias develop most  often near the navel or groin. °There are many kinds of hernias. Common kinds include: °· Femoral hernia. This kind of hernia develops under the groin in the upper thigh area. °· Inguinal hernia. This kind of hernia develops in the groin or scrotum. °· Umbilical hernia. This kind of hernia develops near the navel. °· Hiatal hernia. This kind of hernia causes part of the stomach to be pushed up into the chest. °· Incisional hernia. This kind of hernia bulges through a scar from an abdominal surgery. °CAUSES °This condition may be caused by: °· Heavy lifting. °· Coughing over a long period of time. °· Straining to have a bowel movement. °· An incision made during an abdominal surgery. °· A birth defect (congenital defect). °· Excess weight or obesity. °· Smoking. °· Poor nutrition. °· Cystic fibrosis. °· Excess fluid in the abdomen. °· Undescended testicles. °SYMPTOMS °Symptoms of a hernia include: °· A lump on the abdomen. This is the first sign of a hernia. The lump may become more obvious with standing, straining, or coughing. It may get bigger over time if it is not treated or if the condition causing it is not treated. °· Pain. A hernia is usually painless, but it may become painful over time if treatment is delayed. The pain is usually dull and may get worse with standing or lifting heavy objects. °Sometimes a hernia gets tightly squeezed in the weak spot (strangulated) or stuck there (incarcerated) and causes additional symptoms. These symptoms may include: °· Vomiting. °· Nausea. °· Constipation. °· Irritability. °DIAGNOSIS °A hernia may be diagnosed with: °· A physical exam. During the exam your health care provider may ask you to cough or to make a specific movement, because a hernia is usually more visible when you move. °· Imaging tests. These can include: °¨ X-rays. °¨ Ultrasound. °¨ CT scan. °TREATMENT °A hernia that is small and painless may not need to be treated. A hernia that is large or painful may  be treated with surgery. Inguinal hernias may be treated with surgery to prevent incarceration or strangulation. Strangulated hernias are always treated with surgery, because lack of blood to the trapped organ or tissue can cause it to die. °Surgery to treat a hernia involves pushing the bulge back into place and repairing the weak part of the abdomen. °HOME CARE INSTRUCTIONS °· Avoid straining. °· Do not lift anything heavier than 10 lb (4.5 kg). °· Lift with your leg muscles, not your back muscles. This helps avoid strain. °· When coughing, try to cough gently. °· Prevent constipation. Constipation leads to straining with bowel movements, which can make a hernia worse or cause a hernia repair to break down. You can prevent constipation by: °¨ Eating a high-fiber diet that includes plenty of fruits and vegetables. °¨ Drinking enough fluids to keep your urine clear or pale yellow. Aim to drink 6-8 glasses of water per day. °¨ Using a stool softener as directed by your health care provider. °· Lose weight, if you are overweight. °· Do not use any tobacco products, including cigarettes, chewing tobacco, or electronic cigarettes. If you need help quitting, ask your health care provider. °· Keep all follow-up visits as directed by your health care provider. This is important. Your health care provider may need to monitor your condition. °SEEK MEDICAL CARE IF: °· You have swelling, redness, and pain in the affected area. °· Your bowel habits change. °SEEK IMMEDIATE   MEDICAL CARE IF: °· You have a fever. °· You have abdominal pain that is getting worse. °· You feel nauseous or you vomit. °· You cannot push the hernia back in place by gently pressing on it while you are lying down. °· The hernia: °¨ Changes in shape or size. °¨ Is stuck outside the abdomen. °¨ Becomes discolored. °¨ Feels hard or tender. °  °This information is not intended to replace advice given to you by your health care provider. Make sure you discuss  any questions you have with your health care provider. °  °Document Released: 09/04/2005 Document Revised: 09/25/2014 Document Reviewed: 07/15/2014 °Elsevier Interactive Patient Education ©2016 [employer] ° °

## 2015-07-30 NOTE — Interval H&P Note (Signed)
History and Physical Interval Note:  07/30/2015 3:24 PM  Alexis Cannon  has presented today for surgery, with the diagnosis of Incarcerated incisional ventral wall   The various methods of treatment have been discussed with the patient and family. After consideration of risks, benefits and other options for treatment, the patient has consented to  Procedure(s): [place] (N/A) INSERTION OF MESH (N/A) as a surgical intervention .  The patient's history has been reviewed, patient examined, no change in status, stable for surgery.  I have reviewed the patient's chart and labs.  Questions were answered to the patient's satisfaction.     Nethan Caudillo C.

## 2015-07-30 NOTE — Op Note (Signed)
07/30/2015  6:16 PM  PATIENT:  Alexis Cannon  43 y.o. female  Patient Care Team: Roselee Culver, MD as PCP - General (Family Medicine) Olga Millers, MD as Consulting Physician (Obstetrics and Gynecology) Coralie Keens, MD as Consulting Physician (General Surgery)  PRE-OPERATIVE DIAGNOSIS:  Incarcerated incisional ventral wall hernia  POST-OPERATIVE DIAGNOSIS:  Incarcerated incisional ventral wall hernia   PROCEDURE:  Procedure(s): LAPAROSCOPIC REPAIR OF INCARCERATED INCISIONAL VENTRAL WALL HERNIA REPAIR WITH MESH INSERTION OF MESH LAPAROSCOPIC LYSIS OF ADHESIONS  SURGEON:  Surgeon(s): Michael Boston, MD  ASSISTANT: RN   ANESTHESIA:   local and general  EBL:  Total I/O In: 2000 [I.V.:2000] Out: 225 [Urine:125; Blood:100]  Delay start of Pharmacological VTE agent (>24hrs) due to surgical blood loss or risk of bleeding:  no  DRAINS: none   SPECIMEN:  No Specimen  DISPOSITION OF SPECIMEN:  N/A  COUNTS:  YES  PLAN OF CARE: Discharge to home after PACU  PATIENT DISPOSITION:  PACU - hemodynamically stable.  INDICATION: Pleasant patient has developed a ventral wall abdominal hernia.   Recommendation was made for surgical repair:  The anatomy & physiology of the abdominal wall was discussed. The pathophysiology of hernias was discussed. Natural history risks without surgery including progeressive enlargement, pain, incarceration & strangulation was discussed. Contributors to complications such as smoking, obesity, diabetes, prior surgery, etc were discussed.  I feel the risks of no intervention will lead to serious problems that outweigh the operative risks; therefore, I recommended surgery to reduce and repair the hernia. I explained laparoscopic techniques with possible need for an open approach. I noted the probable use of mesh to patch and/or buttress the hernia repair  Risks such as bleeding, infection, abscess, need for further treatment, heart attack,  death, and other risks were discussed. I noted a good likelihood this will help address the problem. Goals of post-operative recovery were discussed as well. Possibility that this will not correct all symptoms was explained. I stressed the importance of low-impact activity, aggressive pain control, avoiding constipation, & not pushing through pain to minimize risk of post-operative chronic pain or injury. Possibility of reherniation especially with smoking, obesity, diabetes, immunosuppression, and other health conditions was discussed. We will work to minimize complications.  An educational handout further explaining the pathology & treatment options was given as well. Questions were answered. The patient expresses understanding & wishes to proceed with surgery.   OR FINDINGS: Periumbilical incisional hernia incarcerated with omentum.  7 x 7 cm periumbilical Swiss cheese region.   Type of repair - Laparoscopic underlay repair   Name of mesh - Bard Ventralight dual sided (polypropylene / Seprafilm)  Size of mesh - Length 20 cm, Width 25 cm  Mesh overlap - 5-7 cm  Placement of mesh - Intraperitoneal underlay repair   DESCRIPTION:   Informed consent was confirmed. The patient underwent general anaesthesia without difficulty. The patient was positioned appropriately. VTE prevention in place. The patient's abdomen was clipped, prepped, & draped in a sterile fashion. Surgical timeout confirmed our plan.  The patient was positioned in reverse Trendelenburg. Abdominal entry was gained using optical entry technique in the left upper abdomen. Entry was clean. I induced carbon dioxide insufflation. Camera inspection revealed no injury. Extra ports were carefully placed under direct laparoscopic visualization.   I could see the hernia in the central abdomen.  I did laparoscopic lysis of adhesions to expose the entire anterior abdominal wall.  I primarily used and focused cold scissors.   Reduced a  moderate volume of greater omentum out of a Swiss cheese periumbilical hernia with 3 defects.  I made sure hemostasis was good.  I mapped out the region using a needle passer.   To ensure that I would have at least 5 cm radial coverage outside of the hernia defect, I chose a 25x20 cm dual sided mesh.  I placed #1 Prolene stitches around its edge about every 5 cm = 14 total.  I rolled the mesh & placed into the peritoneal cavity through the 10 cm fascial defect.  I unrolled the mesh and positioned it appropriately.  I secured the mesh to cover up the hernia defect using a laparoscopic suture passer to pass the tails of the Prolene through the abdominal wall & tagged them with clamps.  I started out in four corners to make sure I had the mesh centered under the hernia defect appropriately, and then proceeded to work in quadrants.  We evacuated CO2 & desufflated the abdomen.  I tied the fascial stitches down.  I primarily closed the periumbilical ventral incisional hernias using #1 interrupted PDS.   I reinsufflated the abdomen.  The mesh provided at least 5-10 cm circumferential coverage around the entire region of hernia defects.   I tacked the edges & central part of the mesh to the peritoneum/posterior rectus fascia with  SecureStrap absorbable tacks.   There was bleeding on the left lower quadrant paramedian region.  I controlled that using figure-of-eight #1 Prolene sutures 2.  I also placed #1 Prolene sutures in each quadrant around the central part of the mesh to help tack it more centrally as well.  That made the 20 fascial stitches total.   Hemostasis was excellent.  I did local block with quarter percent bupivacaine at the beginning of the case.  I completed with liposomal bupivacaine at the end of the case.  Capnoperitoneum was evacuated. Ports were removed. The skin was closed with Monocryl at the port sites and Steri-Strips on the fascial stitch puncture sites.  Patient is being extubated to go to  the recovery room. I'm about discussed operative findings with the patient's family.   If her pain is controlled, she can go home tonight.  Otherwise may need stay overnight.  We will see.  Adin Hector, M.D., F.A.C.S. Gastrointestinal and Minimally Invasive Surgery [facility] [address] [place], [place] [address] [phone] Main / Paging

## 2015-07-30 NOTE — Anesthesia Procedure Notes (Signed)
Procedure Name: Intubation Date/Time: 07/30/2015 3:42 PM Performed by: Montel Clock Pre-anesthesia Checklist: Patient identified, Emergency Drugs available, Suction available, Patient being monitored and Timeout performed Patient Re-evaluated:Patient Re-evaluated prior to inductionOxygen Delivery Method: Circle system utilized Preoxygenation: Pre-oxygenation with 100% oxygen Intubation Type: IV induction Ventilation: Mask ventilation without difficulty Laryngoscope Size: Mac and 3 Grade View: Grade I Tube type: Oral Tube size: 7.5 mm Number of attempts: 1 Airway Equipment and Method: Stylet Placement Confirmation: ETT inserted through vocal cords under direct vision,  positive ETCO2 and breath sounds checked- equal and bilateral Secured at: 21 cm Tube secured with: Tape Dental Injury: Teeth and Oropharynx as per pre-operative assessment

## 2015-07-30 NOTE — Transfer of Care (Signed)
Immediate Anesthesia Transfer of Care Note  Patient: Alexis Cannon  Procedure(s) Performed: Procedure(s): LAPAROSCOPIC REPAIR OF INCARCERATED INCISIONAL VENTRAL WALL HERNIA REPAIR WITH MESH (N/A) INSERTION OF MESH (N/A) LAPAROSCOPIC LYSIS OF ADHESIONS (N/A)  Patient Location: PACU  Anesthesia Type:General  Level of Consciousness: awake, alert , oriented and patient cooperative  Airway & Oxygen Therapy: Patient Spontanous Breathing and Patient connected to face mask oxygen  Post-op Assessment: Report given to RN, Post -op Vital signs reviewed and stable and Patient moving all extremities X 4  Post vital signs: stable  Last Vitals:  Filed Vitals:   07/30/15 1058  BP: 136/75  Pulse: 87  Temp: 36.6 C  Resp: 18    Complications: No apparent anesthesia complications

## 2015-07-30 NOTE — H&P (View-Only) (Signed)
Joyceann H. Loch 07/20/2015 8:51 AM Location: [facility] Patient #: [id number] DOB: 04/22/1971 Married / Language: English / Race: White Female  History of Present Illness (Brandilynn Taormina C. Aram Domzalski MD; 07/20/2015 9:38 AM) The patient is a 44 year old female who presents with an incisional hernia. Pleasant female comes today with her husband. Sent from [facility] emergency, Vrinda Pickering, NP, for concern of incisional hernia.  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 Did feel some pulling & wondered if she had a hernia year ago. Examination underwhelming. However after that episode of coughing she felt more sharp pain. Went to the emergency room over concerns of a possible appendicitis. CT scan showed evidence of a fat containing periumbilical incisional hernia. Metal clip noted. History of a tubal ligation. However hernia was noted to be reducible when I was called. I recommended outpatient follow-up. Returns 10 days later. Eating okay. Having goblets rather regularly, about 3 times a day with the help of a fiber supplement. Normally can walk about 30 minutes on level ground without difficulty. She does not smoke. She is not diabetic. She is not on blood thinners. There is some question of urinary tract infection. She did have some dysuria after starting Keflex not before. No history of UTIs or other infections in a long time.   Other Problems (Ashley Beck, CMA; 07/20/2015 8:51 AM) Umbilical Hernia Repair  Past Surgical History (Ashley Beck, CMA; 07/20/2015 8:51 AM) No pertinent past surgical history  Diagnostic Studies History (Ashley Beck, CMA; 07/20/2015 8:51 AM) Colonoscopy never Mammogram 1-3 years ago Pap Smear 1-5 years ago  Allergies (Ashley Beck, CMA; 07/20/2015 8:51 AM) Codeine Phosphate *ANALGESICS - OPIOID*  Medication History (Ashley Beck, CMA; 07/20/2015 8:51 AM) Hydrocodone-Acetaminophen (5-325MG Tablet, Oral as needed)  Active. Medications Reconciled  Social History (Ashley Beck, CMA; 07/20/2015 8:51 AM) Alcohol use Occasional alcohol use. Caffeine use Coffee. No drug use Tobacco use Never smoker.  Family History (Ashley Beck, CMA; 07/20/2015 8:51 AM) Heart Disease Father.  Pregnancy / Birth History (Ashley Beck, CMA; 07/20/2015 8:51 AM) Age at menarche 12 years. Gravida 5 Irregular periods Maternal age 26-30 Para 4     Review of Systems (Ashley Beck CMA; 07/20/2015 8:51 AM) General Present- Weight Gain. Not Present- Appetite Loss, Chills, Fatigue, Fever, Night Sweats and Weight Loss. Skin Not Present- Change in Wart/Mole, Dryness, Hives, Jaundice, New Lesions, Non-Healing Wounds, Rash and Ulcer. HEENT Not Present- Earache, Hearing Loss, Hoarseness, Nose Bleed, Oral Ulcers, Ringing in the Ears, Seasonal Allergies, Sinus Pain, Sore Throat, Visual Disturbances, Wears glasses/contact lenses and Yellow Eyes. Respiratory Not Present- Bloody sputum, Chronic Cough, Difficulty Breathing, Snoring and Wheezing. Breast Not Present- Breast Mass, Breast Pain, Nipple Discharge and Skin Changes. Cardiovascular Not Present- Chest Pain, Difficulty Breathing Lying Down, Leg Cramps, Palpitations, Rapid Heart Rate, Shortness of Breath and Swelling of Extremities. Gastrointestinal Present- Abdominal Pain and Gets full quickly at meals. Not Present- Bloating, Bloody Stool, Change in Bowel Habits, Chronic diarrhea, Constipation, Difficulty Swallowing, Excessive gas, Hemorrhoids, Indigestion, Nausea, Rectal Pain and Vomiting. Female Genitourinary Present- Nocturia. Not Present- Frequency, Painful Urination, Pelvic Pain and Urgency. Musculoskeletal Not Present- Back Pain, Joint Pain, Joint Stiffness, Muscle Pain, Muscle Weakness and Swelling of Extremities. Neurological Not Present- Decreased Memory, Fainting, Headaches, Numbness, Seizures, Tingling, Tremor, Trouble walking and Weakness. Psychiatric Not Present-  Anxiety, Bipolar, Change in Sleep Pattern, Depression, Fearful and Frequent crying. Endocrine Not Present- Cold Intolerance, Excessive Hunger, Hair Changes, Heat Intolerance, Hot flashes and New Diabetes. Hematology Not Present- Easy Bruising,   Excessive bleeding, Gland problems, HIV and Persistent Infections.  Vitals (Ashley Beck CMA; 07/20/2015 8:52 AM) 07/20/2015 8:51 AM Weight: 261 lb Height: 63in Body Surface Area: 2.17 m Body Mass Index: 46.23 kg/m  Temp.: 98.4F(Temporal)  Pulse: 83 (Regular)  BP: 130/72 (Sitting, Left Arm, Standard)      Physical Exam (Rio Kidane C. Jissel Slavens MD; 07/20/2015 9:35 AM)  General Mental Status-Alert. General Appearance-Not in acute distress, Not Sickly. Orientation-Oriented X3. Hydration-Well hydrated. Voice-Normal.  Integumentary Global Assessment Upon inspection and palpation of skin surfaces of the - Axillae: non-tender, no inflammation or ulceration, no drainage. and Distribution of scalp and body hair is normal. General Characteristics Temperature - normal warmth is noted.  Head and Neck Head-normocephalic, atraumatic with no lesions or palpable masses. Face Global Assessment - atraumatic, no absence of expression. Neck Global Assessment - no abnormal movements, no bruit auscultated on the right, no bruit auscultated on the left, no decreased range of motion, non-tender. Trachea-midline. Thyroid Gland Characteristics - non-tender.  Eye Eyeball - Left-Extraocular movements intact, No Nystagmus. Eyeball - Right-Extraocular movements intact, No Nystagmus. Cornea - Left-No Hazy. Cornea - Right-No Hazy. Sclera/Conjunctiva - Left-No scleral icterus, No Discharge. Sclera/Conjunctiva - Right-No scleral icterus, No Discharge. Pupil - Left-Direct reaction to light normal. Pupil - Right-Direct reaction to light normal.  ENMT Ears Pinna - Left - no drainage observed, no generalized tenderness observed.  Right - no drainage observed, no generalized tenderness observed. Nose and Sinuses External Inspection of the Nose - no destructive lesion observed. Inspection of the nares - Left - quiet respiration. Right - quiet respiration. Mouth and Throat Lips - Upper Lip - no fissures observed, no pallor noted. Lower Lip - no fissures observed, no pallor noted. Nasopharynx - no discharge present. Oral Cavity/Oropharynx - Tongue - no dryness observed. Oral Mucosa - no cyanosis observed. Hypopharynx - no evidence of airway distress observed.  Chest and Lung Exam Inspection Movements - Normal and Symmetrical. Accessory muscles - No use of accessory muscles in breathing. Palpation Palpation of the chest reveals - Non-tender. Auscultation Breath sounds - Normal and Clear.  Cardiovascular Auscultation Rhythm - Regular. Murmurs & Other Heart Sounds - Auscultation of the heart reveals - No Murmurs and No Systolic Clicks.  Abdomen Inspection Inspection of the abdomen reveals - No Visible peristalsis and No Abnormal pulsations. Umbilicus - No Bleeding, No Urine drainage. Palpation/Percussion Palpation and Percussion of the abdomen reveal - Soft, Non Tender, No Rebound tenderness, No Rigidity (guarding) and No Cutaneous hyperesthesia. Note: Formula obese but soft. Obvious periumbilical mass 6 x 6 cm. Not fully reducible but no evidence of inflammation or guarding. Moderate diastases recti supraumbilically.  Female Genitourinary Sexual Maturity Tanner 5 - Adult hair pattern. Note: Moderate panniculus. Hygiene good panniculus. No inguinal hernias. No yeast infection or cellulitis. No vaginal bleeding nor discharge  Peripheral Vascular Upper Extremity Inspection - Left - No Cyanotic nailbeds, Not Ischemic. Right - No Cyanotic nailbeds, Not Ischemic.  Neurologic Neurologic evaluation reveals -normal attention span and ability to concentrate, able to name objects and repeat phrases. Appropriate fund of  knowledge , normal sensation and normal coordination. Mental Status Affect - not angry, not paranoid. Cranial Nerves-Normal Bilaterally. Gait-Normal.  Neuropsychiatric Mental status exam performed with findings of-able to articulate well with normal speech/language, rate, volume and coherence, thought content normal with ability to perform basic computations and apply abstract reasoning and no evidence of hallucinations, delusions, obsessions or homicidal/suicidal ideation.  Musculoskeletal Global Assessment Spine, Ribs and Pelvis - no instability, subluxation or   laxity. Right Upper Extremity - no instability, subluxation or laxity.  Lymphatic Head & Neck  General Head & Neck Lymphatics: Bilateral - Description - No Localized lymphadenopathy. Axillary  General Axillary Region: Bilateral - Description - No Localized lymphadenopathy. Femoral & Inguinal  Generalized Femoral & Inguinal Lymphatics: Left - Description - No Localized lymphadenopathy. Right - Description - No Localized lymphadenopathy.    Assessment & Plan (Chanelle Hodsdon C. Karlton Maya MD; 07/20/2015 9:40 AM)  INCARCERATED INCISIONAL HERNIA (K43.0) Impression: Because is of moderate size and symptomatically, think it would benefit from surgical repair. We do underlay repair with mesh given her obesity and young age.  I showed the CT scans noting the very small titanium clip. We pointed out CT scan reports as well since it was a struggle for them to obtain it through the My chart in [facility]. I would not necessarily chase that since it sitting in omentum but would remove if it's convenient. I do not think it is a serious risk. They seemed more reassured.  They are motivated to consider surgery to repair this. 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  Current Plans You are being scheduled for surgery - Our schedulers will call you.  You should hear from our office's scheduling department  within 5 working days about the location, date, and time of surgery. We try to make accommodations for patient's preferences in scheduling surgery, but sometimes the OR schedule or the surgeon's schedule prevents us from making those accommodations.  If you have not heard from our office ([phone]) in 5 working days, call the office and ask for your surgeon's nurse.  If you have other questions about your diagnosis, plan, or surgery, call the office and ask for your surgeon's nurse.  Written instructions provided Pt Education - Pamphlet Given - Laparoscopic Hernia Repair: discussed with patient and provided information. The anatomy & physiology of the abdominal wall was discussed. The pathophysiology of hernias was discussed. Natural history risks without surgery including progeressive enlargement, pain, incarceration, & strangulation was discussed. Contributors to complications such as smoking, obesity, diabetes, prior surgery, etc were discussed.  I feel the risks of no intervention will lead to serious problems that outweigh the operative risks; therefore, I recommended surgery to reduce and repair the hernia. I explained laparoscopic techniques with possible need for an open approach. I noted the probable use of mesh to patch and/or buttress the hernia repair  Risks such as bleeding, infection, abscess, need for further treatment, heart attack, death, and other risks were discussed. I noted a good likelihood this will help address the problem. Goals of post-operative recovery were discussed as well. Possibility that this will not correct all symptoms was explained. I stressed the importance of low-impact activity, aggressive pain control, avoiding constipation, & not pushing through pain to minimize risk of post-operative chronic pain or injury. Possibility of reherniation especially with smoking, obesity, diabetes, immunosuppression, and other health conditions was discussed. We will work to  minimize complications.  An educational handout further explaining the pathology & treatment options was given as well. Questions were answered. The patient expresses understanding & wishes to proceed with surgery.  Pt Education - [facility] Hernia Post-Op HCI (Lajoya Dombek): discussed with patient and provided information. Pt Education - [facility] Pain Control (Orlander Norwood)  Anselma Herbel C. Teague Goynes, M.D., F.A.C.S. Gastrointestinal and Minimally Invasive Surgery [facility] [address] [place], [place] [address] [phone] Main / Paging   

## 2015-07-30 NOTE — Anesthesia Preprocedure Evaluation (Signed)
Anesthesia Evaluation  Patient identified by MRN, date of birth, ID band Patient awake    Reviewed: Allergy & Precautions, NPO status , Patient's Chart, lab work & pertinent test results  Airway Mallampati: II  TM Distance: >3 FB Neck ROM: Full    Dental no notable dental hx.    Pulmonary neg pulmonary ROS,    Pulmonary exam normal breath sounds clear to auscultation       Cardiovascular negative cardio ROS Normal cardiovascular exam Rhythm:Regular Rate:Normal     Neuro/Psych negative neurological ROS  negative psych ROS   GI/Hepatic negative GI ROS, Neg liver ROS,   Endo/Other  Morbid obesity  Renal/GU negative Renal ROS  negative genitourinary   Musculoskeletal negative musculoskeletal ROS (+)   Abdominal   Peds negative pediatric ROS (+)  Hematology negative hematology ROS (+)   Anesthesia Other Findings   Reproductive/Obstetrics negative OB ROS                            Anesthesia Physical Anesthesia Plan  ASA: III  Anesthesia Plan: General   Post-op Pain Management:    Induction: Intravenous  Airway Management Planned: Oral ETT  Additional Equipment:   Intra-op Plan:   Post-operative Plan: Extubation in OR  Informed Consent: I have reviewed the patients History and Physical, chart, labs and discussed the procedure including the risks, benefits and alternatives for the proposed anesthesia with the patient or authorized representative who has indicated his/her understanding and acceptance.   Dental advisory given  Plan Discussed with: CRNA  Anesthesia Plan Comments:         Anesthesia Quick Evaluation  

## 2015-07-31 DIAGNOSIS — K430 Incisional hernia with obstruction, without gangrene: Secondary | ICD-10-CM | POA: Diagnosis not present

## 2015-07-31 NOTE — Discharge Summary (Signed)
Physician Discharge Summary  Patient ID: Alexis Cannon MRN: [id number] DOB/AGE: 1970-12-27 44 y.o.  Admit date: 07/30/2015 Discharge date: 07/31/2015  Admission Diagnoses:  Ventral incarcerated hernia  Discharge Diagnoses:  Same post repair  Principal Problem:   Incisional incarcerated hernia s/p lap repair w mesh 07/30/2015   Surgery:  Lap repair of ventral incisional hernia  Discharged Condition: improved;  Hospital Course:   Had lap surgery on Friday afternoon.  Kept overnight and ready for discharge on Saturday.   Consults: none  Significant Diagnostic Studies: none    Discharge Exam: Blood pressure 128/72, pulse 85, temperature 98.1 F (36.7 C), temperature source Oral, resp. rate 16, height 5\' 3"  (1.6 m), weight 116.801 kg (257 lb 8 oz), last menstrual period 07/04/2015, SpO2 95 %. Incisions covered.  Patient with abdominal binder.    Disposition: 01-Home or Self Care  Discharge Instructions    Call MD for:  extreme fatigue    Complete by:  As directed      Call MD for:  hives    Complete by:  As directed      Call MD for:  persistant nausea and vomiting    Complete by:  As directed      Call MD for:  redness, tenderness, or signs of infection (pain, swelling, redness, odor or green/yellow discharge around incision site)    Complete by:  As directed      Call MD for:  severe uncontrolled pain    Complete by:  As directed      Call MD for:    Complete by:  As directed   Temperature > 101.7F     Diet - low sodium heart healthy    Complete by:  As directed      Diet - low sodium heart healthy    Complete by:  As directed      Discharge instructions    Complete by:  As directed   Please see discharge instruction sheets.  Also refer to handout given an office.  Please call our office if you have any questions or concerns [phone]     Discharge wound care:    Complete by:  As directed   If you have closed incisions, shower and bathe over these  incisions with soap and water every day.  Remove all surgical dressings on postoperative day #3.  You do not need to replace dressings over the closed incisions unless you feel more comfortable with a Band-Aid covering it.   If you have an open wound that requires packing, please see wound care instructions.  In general, remove all dressings, wash wound with soap and water and then replace with saline moistened gauze.  Do the dressing change at least every day.  Please call our office [phone] if you have further questions.     Driving Restrictions    Complete by:  As directed   No driving until off narcotics and can safely swerve away without pain during an emergency     Increase activity slowly    Complete by:  As directed   Walk an hour a day.  Use 20-30 minute walks.  When you can walk 30 minutes without difficulty, it is fine to restart low impact/moderate activities such as biking, jogging, swimming, sexual activity, etc.  Eventually you can increase to unrestricted activity when not feeling pain.  If you feel pain: STOP!Marland Kitchen   Let pain protect you from overdoing it.  Use ice/heat & over-the-counter pain medications  to help minimize soreness.  If that is not enough, then use your narcotic pain prescription as needed to remain active.  It is better to take extra pain medications and be more active than to stay bedridden to avoid all pain medications.     Increase activity slowly    Complete by:  As directed      Lifting restrictions    Complete by:  As directed   Avoid heavy lifting initially.  Do not push through pain.  You have no specific weight limit - if it hurts to do, DON'T DO IT.   If you feel no pain, you are not injuring anything.  Pain will protect you from injury.  Coughing and sneezing are far more stressful to your incision than any lifting.  Avoid resuming heavy lifting / intense activity until off all narcotic pain medications.  When ready to exercise more, give yourself 2 weeks to  gradually get back to full intense exercise/activity.     May shower / Bathe    Complete by:  As directed      May walk up steps    Complete by:  As directed      Sexual Activity Restrictions    Complete by:  As directed   Sexual activity as tolerated.  Do not push through pain.  Pain will protect you from injury.     Walk with assistance    Complete by:  As directed   Walk over an hour a day.  May use a walker/cane/companion to help with balance and stamina.            Medication List    STOP taking these medications        HYDROcodone-acetaminophen 5-325 MG tablet  Commonly known as:  NORCO/VICODIN  Replaced by:  HYDROcodone-acetaminophen 10-325 MG tablet      TAKE these medications        HYDROcodone-acetaminophen 10-325 MG tablet  Commonly known as:  NORCO  Take 1-2 tablets by mouth every 6 (six) hours as needed for moderate pain or severe pain.     naproxen 500 MG tablet  Commonly known as:  NAPROSYN  Take 1 tablet (500 mg total) by mouth 2 (two) times daily with a meal.           Follow-up Information    Follow up with GROSS,STEVEN C., MD. Schedule an appointment as soon as possible for a visit in 3 weeks.   Specialty:  General Surgery   Why:  To follow up after your hospital stay   Contact information:   [address] [place] [address] [phone]       Signed: Pedro Earls 07/31/2015, 10:04 AM

## 2015-07-31 NOTE — Progress Notes (Signed)
Assessment unchanged. Pt and husband verbalized understanding of dc instructions through teach back including follow up care and when to call doctor. No scripts at dc. Discharged via wc to front entrance to meet husband and awaiting vehicle to carry home. Accompanied by NT.

## 2015-07-31 NOTE — Anesthesia Postprocedure Evaluation (Signed)
  Anesthesia Post-op Note  Patient: Alexis Cannon  Procedure(s) Performed: Procedure(s) (LRB): LAPAROSCOPIC REPAIR OF INCARCERATED INCISIONAL VENTRAL WALL HERNIA REPAIR WITH MESH (N/A) INSERTION OF MESH (N/A) LAPAROSCOPIC LYSIS OF ADHESIONS (N/A)  Patient Location: PACU  Anesthesia Type: General  Level of Consciousness: awake and alert   Airway and Oxygen Therapy: Patient Spontanous Breathing  Post-op Pain: mild  Post-op Assessment: Post-op Vital signs reviewed, Patient's Cardiovascular Status Stable, Respiratory Function Stable, Patent Airway and No signs of Nausea or vomiting  Last Vitals:  Filed Vitals:   07/31/15 0547  BP: 128/72  Pulse: 85  Temp: 36.7 C  Resp: 16    Post-op Vital Signs: stable   Complications: No apparent anesthesia complications

## 2015-07-31 NOTE — Progress Notes (Signed)
Patient has numbness and tingling to thumb,index finger,and middle finger of left hand,and bluish color to base of  nailbed to fingers on both hands.Vital Signs - T 98.7  P 85 R 16 B/P 126/67 O2Sat  97% on 1L O2. Brien Mates ,MD notified

## 2015-08-02 ENCOUNTER — Encounter (HOSPITAL_COMMUNITY): Payer: Self-pay | Admitting: Surgery

## 2016-07-17 ENCOUNTER — Encounter (INDEPENDENT_AMBULATORY_CARE_PROVIDER_SITE_OTHER): Payer: Self-pay | Admitting: *Deleted

## 2021-04-09 ENCOUNTER — Emergency Department (HOSPITAL_BASED_OUTPATIENT_CLINIC_OR_DEPARTMENT_OTHER): Payer: BC Managed Care – PPO

## 2021-04-09 ENCOUNTER — Other Ambulatory Visit: Payer: Self-pay

## 2021-04-09 ENCOUNTER — Emergency Department (HOSPITAL_BASED_OUTPATIENT_CLINIC_OR_DEPARTMENT_OTHER)
Admission: EM | Admit: 2021-04-09 | Discharge: 2021-04-09 | Disposition: A | Discharge status: Home / Self Care | Payer: BC Managed Care – PPO | Attending: Emergency Medicine | Admitting: Emergency Medicine

## 2021-04-09 DIAGNOSIS — Y92410 Unspecified street and highway as the place of occurrence of the external cause: Secondary | ICD-10-CM | POA: Diagnosis not present

## 2021-04-09 DIAGNOSIS — M542 Cervicalgia: Secondary | ICD-10-CM

## 2021-04-09 DIAGNOSIS — R4182 Altered mental status, unspecified: Secondary | ICD-10-CM | POA: Insufficient documentation

## 2021-04-09 DIAGNOSIS — R519 Headache, unspecified: Secondary | ICD-10-CM | POA: Insufficient documentation

## 2021-04-09 MED ORDER — CYCLOBENZAPRINE HCL 10 MG PO TABS: 10.0000 mg | ORAL_TABLET | Freq: Two times a day (BID) | ORAL | 0 refills | Status: AC | PRN

## 2021-04-09 MED ORDER — CYCLOBENZAPRINE HCL 10 MG PO TABS
10.0000 mg | ORAL_TABLET | Freq: Once | ORAL | Status: AC
  Administered 2021-04-09: 10 mg via ORAL
  Filled 2021-04-09: qty 1

## 2021-04-09 NOTE — ED Notes (Signed)
Report called to Dillon Bjork RN
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
## 2021-04-09 NOTE — ED Notes (Addendum)
Pt. And her daughter were d/c'd. By Dr. Ronnald Nian.

## 2021-04-09 NOTE — ED Provider Notes (Signed)
Head and neck CT is unremarkable.  Prescribed Flexeril for likely muscle spasms.  Discharged in good condition.   Lennice Sites, DO 04/09/21 1409

## 2021-04-09 NOTE — Discharge Instructions (Addendum)
Recommend Tylenol, Motrin for pain.  Take Flexeril to help with muscle spasms.
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
